# Patient Record
Sex: Male | Born: 1960
Health system: Southern US, Community
[De-identification: ages and names within clinical notes are randomized; demographics above are authoritative.]

## PROBLEM LIST (undated history)

## (undated) DIAGNOSIS — F17200 Nicotine dependence, unspecified, uncomplicated: Secondary | ICD-10-CM

## (undated) DIAGNOSIS — E785 Hyperlipidemia, unspecified: Secondary | ICD-10-CM

## (undated) DIAGNOSIS — M5030 Other cervical disc degeneration, unspecified cervical region: Secondary | ICD-10-CM

## (undated) DIAGNOSIS — F419 Anxiety disorder, unspecified: Secondary | ICD-10-CM

## (undated) HISTORY — DX: Anxiety disorder, unspecified: F41.9

## (undated) HISTORY — PX: BACK SURGERY: SHX140

## (undated) HISTORY — DX: Other cervical disc degeneration, unspecified cervical region: M50.30

## (undated) HISTORY — DX: Nicotine dependence, unspecified, uncomplicated: F17.200

## (undated) HISTORY — DX: Hyperlipidemia, unspecified: E78.5

## (undated) HISTORY — PX: SPINE SURGERY: SHX786

---

## 2000-08-07 ENCOUNTER — Ambulatory Visit (HOSPITAL_COMMUNITY): Admission: RE | Admit: 2000-08-07 | Discharge: 2000-08-07 | Payer: Self-pay

## 2000-11-25 ENCOUNTER — Emergency Department (HOSPITAL_COMMUNITY): Admission: EM | Admit: 2000-11-25 | Discharge: 2000-11-25 | Payer: Self-pay | Admitting: Emergency Medicine

## 2000-12-10 ENCOUNTER — Ambulatory Visit (HOSPITAL_COMMUNITY): Admission: RE | Admit: 2000-12-10 | Discharge: 2000-12-10 | Payer: Self-pay

## 2005-01-21 ENCOUNTER — Ambulatory Visit: Payer: Self-pay

## 2005-05-20 ENCOUNTER — Emergency Department: Payer: Self-pay | Admitting: Unknown Physician Specialty

## 2007-04-30 ENCOUNTER — Ambulatory Visit (HOSPITAL_COMMUNITY): Admission: RE | Admit: 2007-04-30 | Discharge: 2007-04-30 | Payer: Self-pay

## 2008-12-31 ENCOUNTER — Ambulatory Visit: Payer: Self-pay

## 2009-01-11 ENCOUNTER — Ambulatory Visit: Payer: Self-pay

## 2009-08-21 ENCOUNTER — Ambulatory Visit: Payer: Self-pay | Admitting: Dermatology

## 2010-05-26 HISTORY — PX: US ECHOCARDIOGRAPHY: HXRAD669

## 2010-05-26 HISTORY — PX: NM MYOCAR PERF WALL MOTION: HXRAD629

## 2010-08-08 ENCOUNTER — Ambulatory Visit: Payer: Self-pay | Admitting: General Surgery

## 2011-08-11 ENCOUNTER — Other Ambulatory Visit (HOSPITAL_COMMUNITY): Payer: Self-pay

## 2011-08-11 DIAGNOSIS — M546 Pain in thoracic spine: Secondary | ICD-10-CM

## 2011-08-13 ENCOUNTER — Ambulatory Visit (HOSPITAL_COMMUNITY)
Admission: RE | Admit: 2011-08-13 | Discharge: 2011-08-13 | Disposition: A | Discharge status: Home / Self Care | Payer: Medicare Other | Source: Ambulatory Visit

## 2011-08-13 DIAGNOSIS — M5124 Other intervertebral disc displacement, thoracic region: Secondary | ICD-10-CM | POA: Insufficient documentation

## 2011-08-13 DIAGNOSIS — M546 Pain in thoracic spine: Secondary | ICD-10-CM

## 2011-08-13 DIAGNOSIS — M4200 Juvenile osteochondrosis of spine, site unspecified: Secondary | ICD-10-CM | POA: Insufficient documentation

## 2011-08-27 ENCOUNTER — Other Ambulatory Visit (HOSPITAL_COMMUNITY): Payer: Self-pay

## 2011-08-27 DIAGNOSIS — M79602 Pain in left arm: Secondary | ICD-10-CM

## 2011-08-27 DIAGNOSIS — M542 Cervicalgia: Secondary | ICD-10-CM

## 2011-09-01 ENCOUNTER — Ambulatory Visit (HOSPITAL_COMMUNITY)
Admission: RE | Admit: 2011-09-01 | Discharge: 2011-09-01 | Disposition: A | Discharge status: Home / Self Care | Payer: Medicare Other | Source: Ambulatory Visit

## 2011-09-01 ENCOUNTER — Other Ambulatory Visit (HOSPITAL_COMMUNITY): Payer: Medicaid Other

## 2011-09-01 DIAGNOSIS — M79601 Pain in right arm: Secondary | ICD-10-CM

## 2011-09-01 DIAGNOSIS — M47812 Spondylosis without myelopathy or radiculopathy, cervical region: Secondary | ICD-10-CM | POA: Insufficient documentation

## 2011-09-01 DIAGNOSIS — M5030 Other cervical disc degeneration, unspecified cervical region: Secondary | ICD-10-CM | POA: Insufficient documentation

## 2011-09-01 DIAGNOSIS — M542 Cervicalgia: Secondary | ICD-10-CM

## 2011-09-08 ENCOUNTER — Other Ambulatory Visit (HOSPITAL_COMMUNITY): Payer: Medicaid Other

## 2012-10-13 ENCOUNTER — Emergency Department: Payer: Self-pay | Admitting: Unknown Physician Specialty

## 2012-10-13 LAB — URINALYSIS, COMPLETE
Ketone: NEGATIVE
Ph: 5 (ref 4.5–8.0)
RBC,UR: 55 /HPF (ref 0–5)
Specific Gravity: 1.013 (ref 1.003–1.030)
WBC UR: 1 /HPF (ref 0–5)

## 2012-10-13 LAB — COMPREHENSIVE METABOLIC PANEL
Albumin: 3.5 g/dL (ref 3.4–5.0)
Alkaline Phosphatase: 69 U/L (ref 50–136)
Anion Gap: 6 — ABNORMAL LOW (ref 7–16)
BUN: 15 mg/dL (ref 7–18)
Bilirubin,Total: 0.4 mg/dL (ref 0.2–1.0)
Co2: 26 mmol/L (ref 21–32)
Creatinine: 1.25 mg/dL (ref 0.60–1.30)
EGFR (African American): >60
Osmolality: 286 (ref 275–301)
Potassium: 3.7 mmol/L (ref 3.5–5.1)
SGOT(AST): 30 U/L (ref 15–37)
Total Protein: 6.2 g/dL — ABNORMAL LOW (ref 6.4–8.2)

## 2012-10-13 LAB — CBC
HGB: 13.9 g/dL (ref 13.0–18.0)
RDW: 13.4 % (ref 11.5–14.5)
WBC: 11.9 10*3/uL — ABNORMAL HIGH (ref 3.8–10.6)

## 2012-10-13 LAB — LIPASE, BLOOD: Lipase: 187 U/L (ref 73–393)

## 2012-10-28 ENCOUNTER — Ambulatory Visit: Payer: Medicare Other | Admitting: Cardiovascular Disease

## 2012-11-01 ENCOUNTER — Ambulatory Visit: Payer: Medicare Other | Admitting: Cardiovascular Disease

## 2012-11-03 ENCOUNTER — Ambulatory Visit (INDEPENDENT_AMBULATORY_CARE_PROVIDER_SITE_OTHER): Payer: Medicare Other | Admitting: Cardiovascular Disease

## 2012-11-03 ENCOUNTER — Encounter: Payer: Self-pay | Admitting: Cardiovascular Disease

## 2012-11-03 VITALS — BP 140/86 | HR 59 | Ht 71.0 in | Wt 209.6 lb

## 2012-11-03 DIAGNOSIS — E559 Vitamin D deficiency, unspecified: Secondary | ICD-10-CM

## 2012-11-03 DIAGNOSIS — R5383 Other fatigue: Secondary | ICD-10-CM

## 2012-11-03 DIAGNOSIS — E782 Mixed hyperlipidemia: Secondary | ICD-10-CM

## 2012-11-03 DIAGNOSIS — Z79899 Other long term (current) drug therapy: Secondary | ICD-10-CM

## 2012-11-03 DIAGNOSIS — Z720 Tobacco use: Secondary | ICD-10-CM | POA: Insufficient documentation

## 2012-11-03 DIAGNOSIS — R5381 Other malaise: Secondary | ICD-10-CM

## 2012-11-03 DIAGNOSIS — Z8249 Family history of ischemic heart disease and other diseases of the circulatory system: Secondary | ICD-10-CM | POA: Insufficient documentation

## 2012-11-03 DIAGNOSIS — F17200 Nicotine dependence, unspecified, uncomplicated: Secondary | ICD-10-CM

## 2012-11-03 DIAGNOSIS — E785 Hyperlipidemia, unspecified: Secondary | ICD-10-CM

## 2012-11-03 MED ORDER — ICOSAPENT ETHYL 1 G PO CAPS: 1.0000 g | ORAL_CAPSULE | Freq: Two times a day (BID) | ORAL | Status: DC

## 2012-11-03 MED ORDER — FENOFIBRATE 145 MG PO TABS: 145.0000 mg | ORAL_TABLET | Freq: Every day | ORAL | Status: DC

## 2012-11-03 NOTE — Progress Notes (Signed)
Patient ID: George Mclaughlin, male   DOB: [birth year], 52 y.o.   MRN: [id number]     HPI: Tre Sanker, is a 52 y.o. male who presents to the office today for cardiology evaluation. I last saw him over 2 1/5  years ago.  Mr. Zuercher is a strong family history for coronary artery disease. He is now 52 years old by her as a history of significant hyperlipidemia, tobacco use, as well as EtOH use drinking predominantly beer. In 2012 his cholesterol was 236, triglycerides 340, VLDL 68 and his LDL was 133 suggestive of an atherogenic dyslipidemia pattern. An echo Doppler study showed normal size and function with mild mitral and tricuspid regurgitation. A nuclear perfusion study for atypical chest pain was normal. He was initially started on Simcor 500/20. Apparently, he smokes 2-3 cigarettes several days per week but does not smoke every day. I have not seen him since. He sees Dr. Loma Sender for primary care. He states he did not been taking his previous medications. He was given a prescription for Crestor by [name] and he states at times he takes his 20 mg pill one half perhaps once or maybe twice per week but not consistently. He tells me [name] recently checked laboratory and he did not bring this with him today. But he recalls that his triglyceride was approximately 435. He did not know his other numbers.  He denies recent chest pain. Denies shortness of breath. 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 Past Medical History  Diagnosis Date  . Hyperlipidemia   . Kidney stones     Past Surgical History  Procedure Laterality Date  . Back surgery      No Known Allergies  Current Outpatient Prescriptions  Medication Sig Dispense Refill  . esomeprazole (NEXIUM) 20 MG capsule Take 20 mg by mouth daily before breakfast.      . rosuvastatin (CRESTOR) 20 MG tablet Take 20 mg by mouth daily. Takes rarely      . ALPRAZolam (XANAX) 1 MG tablet 1 mg 3 (three)  times daily as needed.       . cyclobenzaprine (FLEXERIL) 10 MG tablet 10 mg as needed.       . meloxicam (MOBIC) 7.5 MG tablet 7.5 mg as needed.       . Oxycodone HCl 10 MG TABS Take 10 mg by mouth. Prn       No current facility-administered medications for this visit.    Socially He is divorced. The past he used to drink several sixpacks of beer a week. He states he currently smokes at most 2 cigarettes on the days that he smokes but other days he does not smoke. He has 2 children. He is divorced. He takes care of his mother [name] who is one of my patients.  ROS is negative for fevers, chills or night sweats. He denies visual symptoms. He denies chest pain. Denies palpitations. There is no wheezing. He does have chronic back pain. He states he's been on disability since 2 back operations with you had done at [facility]. He denies bleeding. He does have abdominal upset intermittently. He denies diarrhea. He denies edema there are no claudication symptoms. He denies rash. At times he does note some myalgias.  Other system review is negative.  PE BP 140/86  Pulse 59  Ht 5\' 11"  (1.803 m)  Wt 209 lb 9.6 oz (95.074 kg)  BMI 29.25 kg/m2  General: Alert, oriented,  no distress.  Skin: normal turgor, no rashes HEENT: Normocephalic, atraumatic. Pupils round and reactive; sclera anicteric;no lid lag.  Nose without nasal septal hypertrophy Mouth/Parynx benign; Mallinpatti scale 3 Neck: No JVD, no carotid briuts Lungs: clear to ausculatation and percussion; no wheezing or rales Heart: RRR, s1 s2 normal  Abdomen: soft, nontender; no hepatosplenomehaly, BS+; abdominal aorta nontender and not dilated by palpation. Pulses 2+ Extremities: no clubbing cyanosis or edema, Homan's sign negative  Neurologic: grossly nonfocal  ECG: Normal sinus rhythm at 59 beats per minute. QTc interval. 6 ms  LABS:  BMET No results found for this basename: na, k, cl, co2, glucose, bun, creatinine, calcium,  gfrnonaa, gfraa     Hepatic Function Panel  No results found for this basename: prot, albumin, ast, alt, alkphos, bilitot, bilidir, ibili     CBC No results found for this basename: wbc, rbc, hgb, hct, plt, mcv, mch, mchc, rdw, neutrabs, lymphsabs, monoabs, eosabs, basosabs     BNP No results found for this basename: probnp    Lipid Panel  No results found for this basename: chol, trig, hdl, cholhdl, vldl, ldlcalc     RADIOLOGY: No results found.    ASSESSMENT AND PLAN: My impression is that Mr. Pecolia Ades is a 52 year old gentleman who has a significant family history for coronary artery disease with both parents having undergone CABG vascularization surgery. He has a history of marked hyperlipidemia with an atherogenic dyslipidemia pattern demented for several years. He does drink beer. Presently, I will try to obtain the results from his laboratory by [name]. I am recommending the addition of fenofibrate 145 mg to his medical regimen have also recommended the septa 2 capsules twice a day in light of his marked hypertriglyceridemia. I suggested he at least try taking the Crestor 10 mg every other day. I discussed smoking cessation. In 6 weeks an NMR lipoprofile, CBC, CMP,TSH, and Vit D  Level and will see him back in the office in 2 months for evaluation.    Lennette Bihari, MD, [facility]  11/03/2012 8:46 AM

## 2012-11-03 NOTE — Patient Instructions (Signed)
Your physician has recommended you make the following change in your medication: FENOFIBRATE and VASCEPRA has been sent to your pharmacy. Take as directed on the bottles. Take your crestor as directed By Dr. Tresa Endo.  Your physician recommends that you return for lab work fasting in 6 WEEKS.  Your physician recommends that you schedule a follow-up appointment in: 2-3 months.

## 2012-12-20 ENCOUNTER — Other Ambulatory Visit: Payer: Self-pay | Admitting: *Deleted

## 2012-12-20 ENCOUNTER — Telehealth: Payer: Self-pay | Admitting: Cardiovascular Disease

## 2012-12-20 DIAGNOSIS — R5381 Other malaise: Secondary | ICD-10-CM

## 2012-12-20 DIAGNOSIS — E782 Mixed hyperlipidemia: Secondary | ICD-10-CM

## 2012-12-20 DIAGNOSIS — Z79899 Other long term (current) drug therapy: Secondary | ICD-10-CM

## 2012-12-20 NOTE — Telephone Encounter (Signed)
Lab orders placed in the epic system per patient's request. He has appointment to see Dr. Tresa Endo later this month.

## 2012-12-20 NOTE — Telephone Encounter (Signed)
Has an appt with Dr Tresa Endo on 01-09-13-Suppose to have lab work before appt-he wants his lab work downstairs-Would you please send an order down there-He will have the lab work the week of Sept. 11th.

## 2012-12-29 LAB — CBC
HCT: 44.9 % (ref 39.0–52.0)
Hemoglobin: 15.8 g/dL (ref 13.0–17.0)
MCH: 31.6 pg (ref 26.0–34.0)
MCHC: 35.2 g/dL (ref 30.0–36.0)
MCV: 89.8 fL (ref 78.0–100.0)
Platelets: 189 K/uL (ref 150–400)
RBC: 5 MIL/uL (ref 4.22–5.81)
RDW: 13.7 % (ref 11.5–15.5)
WBC: 5.3 K/uL (ref 4.0–10.5)

## 2012-12-29 LAB — COMPREHENSIVE METABOLIC PANEL
AST: 32 U/L (ref 0–37)
Albumin: 4.3 g/dL (ref 3.5–5.2)
Alkaline Phosphatase: 56 U/L (ref 39–117)
BUN: 9 mg/dL (ref 6–23)
Calcium: 9.5 mg/dL (ref 8.4–10.5)
Chloride: 104 mEq/L (ref 96–112)
Potassium: 3.9 mEq/L (ref 3.5–5.3)
Sodium: 140 mEq/L (ref 135–145)
Total Protein: 6.7 g/dL (ref 6.0–8.3)

## 2012-12-30 LAB — NMR LIPOPROFILE WITH LIPIDS
Cholesterol, Total: 135 mg/dL (ref <200)
HDL Particle Number: 29.6 umol/L — ABNORMAL LOW (ref >=30.5)
LDL (calc): 75 mg/dL (ref <100)
LDL Size: 20.6 nm (ref >20.5)
LP-IR Score: 59 — ABNORMAL HIGH (ref <=45)
Large HDL-P: 2.2 umol/L — ABNORMAL LOW (ref >=4.8)
Large VLDL-P: 1.6 nmol/L (ref <=2.7)
Small LDL Particle Number: 416 nmol/L (ref <=527)

## 2012-12-30 LAB — TSH: TSH: 0.497 u[IU]/mL (ref 0.350–4.500)

## 2013-01-08 NOTE — Progress Notes (Signed)
Quick Note:  Results will be discussed at 9/22 appointment. ______

## 2013-01-09 ENCOUNTER — Ambulatory Visit (INDEPENDENT_AMBULATORY_CARE_PROVIDER_SITE_OTHER): Payer: Medicare Other | Admitting: Cardiovascular Disease

## 2013-01-09 ENCOUNTER — Encounter: Payer: Self-pay | Admitting: Cardiovascular Disease

## 2013-01-09 VITALS — BP 122/100 | HR 75 | Ht 69.0 in | Wt 203.6 lb

## 2013-01-09 DIAGNOSIS — F17200 Nicotine dependence, unspecified, uncomplicated: Secondary | ICD-10-CM

## 2013-01-09 DIAGNOSIS — Z8249 Family history of ischemic heart disease and other diseases of the circulatory system: Secondary | ICD-10-CM

## 2013-01-09 DIAGNOSIS — I2510 Atherosclerotic heart disease of native coronary artery without angina pectoris: Secondary | ICD-10-CM

## 2013-01-09 DIAGNOSIS — E782 Mixed hyperlipidemia: Secondary | ICD-10-CM

## 2013-01-09 DIAGNOSIS — Z720 Tobacco use: Secondary | ICD-10-CM

## 2013-01-09 DIAGNOSIS — E8881 Metabolic syndrome: Secondary | ICD-10-CM

## 2013-01-09 DIAGNOSIS — N529 Male erectile dysfunction, unspecified: Secondary | ICD-10-CM

## 2013-01-09 MED ORDER — SILDENAFIL CITRATE 50 MG PO TABS: ORAL_TABLET | ORAL | Status: DC

## 2013-01-09 NOTE — Patient Instructions (Addendum)
Your physician recommends that you return for lab work in: 6 MONTHS.  Your physician recommends that you schedule a follow-up appointment in: 6 months.  No changes was made in your therphy today.

## 2013-01-09 NOTE — Progress Notes (Signed)
Patient ID: George Mclaughlin, male   DOB: [birth year], 52 y.o.   MRN: [id number]     HPI: George Mclaughlin, is a 52 y.o. male who presents to the office today for cardiology evaluation. I last saw him 2 months ago.  George Mclaughlin is a 52 yo WM with a strong family history for coronary artery disease. He as a history of significant hyperlipidemia, tobacco use, as well as EtOH use drinking predominantly beer. In 2012 his cholesterol was 236, triglycerides 340, VLDL 68 and his LDL was 133 suggestive of an atherogenic dyslipidemia pattern. An echo Doppler study showed normal size and function with mild mitral and tricuspid regurgitation. A nuclear perfusion study for atypical chest pain was normal. He was initially started on Simcor 500/20. He smokes 2-3 cigarettes several days per week but does not smoke every day. He sees [name] for primary care per recent lab work by [name] apparently had shown a triglyceride level of 435. Patient has significantly reduced  his EtOH intake. I saw him 2 months ago at which time I added fenofibrate 145 mg was medical regimen and also started him on Vascepa 2 capsules twice a day in light of his marked hypertriglyceridemia. Also recommended he take Crestor 10 mg daily or every other day since he had been given a trial of this by [name] but had not been taking this he states over the past 2 months he has been much more short with reference to his diet. I did obtain recent laboratory which now shows marked improvement from previously. Laboratory from 01/05/2013 has a hemoglobin of 15.8 hematocrit 44.9. Neck his cholesterol is now 135 triglycerides 107. LDL calculated was 75 and LDL particle number by NMR was 1098. Insulin resistance score was elevated at 59. LDL particle size is now normal at 416. He denies recent chest pain. Denies shortness of breath. 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 He states he also has recently been having some  difficulty with some erectile function and also was requesting a potential for when necessary Viagra.    Past Medical History  Diagnosis Date  . Hyperlipidemia   . Kidney stones     Past Surgical History  Procedure Laterality Date  . Back surgery      No Known Allergies  Current Outpatient Prescriptions  Medication Sig Dispense Refill  . ALPRAZolam (XANAX) 1 MG tablet 1 mg 3 (three) times daily as needed.       . cyclobenzaprine (FLEXERIL) 10 MG tablet 10 mg as needed.       Marland Kitchen esomeprazole (NEXIUM) 20 MG capsule Take 20 mg by mouth daily before breakfast. PRN      . fenofibrate (TRICOR) 145 MG tablet Take 1 tablet (145 mg total) by mouth daily.  30 tablet  6  . Icosapent Ethyl (VASCEPA) 1 G CAPS Take 1 g by mouth 2 (two) times daily. 2 capsules  120 capsule  6  . meloxicam (MOBIC) 7.5 MG tablet 7.5 mg as needed.       . Oxycodone HCl 10 MG TABS Take 10 mg by mouth. Prn      . rosuvastatin (CRESTOR) 20 MG tablet Take 20 mg by mouth daily. Takes rarely       No current facility-administered medications for this visit.    Socially He is divorced. Her motor he used to drink several sixpacks of beer a week. He states he currently smokes at most 2 cigarettes  on the days that he smokes but other days he does not smoke. He has significantly reduced his EtOH intake. He has 2 children. He is divorced. He takes care of his mother [name] who is one of my patients.  ROS is negative for fevers, chills or night sweats. He denies visual symptoms. He denies chest pain. Denies palpitations. There is no wheezing. He does have chronic back pain. He states he's been on disability since 2 back operations with you had done at [facility]. He denies bleeding. He does have abdominal upset intermittently. He denies diarrhea. He recently was bothered by kidney stones and ultimately passed this several weeks ago. He does note some intermittent reduced urinary stream.  He has been dating a 50 year old woman. He  has noted some difficulty with maintenance of erectile function. He denies edema there are no claudication symptoms. He denies rash. He feels that he is now tolerating his medications.  Other system review is negative.  PE BP 122/100  Pulse 75  Ht 5\' 9"  (1.753 m)  Wt 203 lb 9.6 oz (92.352 kg)  BMI 30.05 kg/m2  General: Alert, oriented, no distress.  Skin: normal turgor, no rashes HEENT: Normocephalic, atraumatic. Pupils round and reactive; sclera anicteric;no lid lag.  Nose without nasal septal hypertrophy Mouth/Parynx benign; Mallinpatti scale 3 Neck: No JVD, no carotid briuts Lungs: clear to ausculatation and percussion; no wheezing or rales Heart: RRR, s1 s2 normal  Abdomen: soft, nontender; no hepatosplenomehaly, BS+; abdominal aorta nontender and not dilated by palpation. Pulses 2+ Extremities: no clubbing cyanosis or edema, Homan's sign negative  Neurologic: grossly nonfocal  ECG: Normal sinus rhythm at75 beats per minute. Nonspecific T. change.  LABS:  BMET    Component Value Date/Time   NA 140 12/29/2012 0959     Hepatic Function Panel     Component Value Date/Time   PROT 6.7 12/29/2012 0959     CBC    Component Value Date/Time   WBC 5.3 12/29/2012 0959     BNP No results found for this basename: probnp    Lipid Panel  No results found for this basename: chol,  trig,  hdl,  cholhdl,  vldl,  ldlcalc     RADIOLOGY: No results found.    ASSESSMENT AND PLAN: Mr. George Mclaughlin feels significantly improved from several months ago. He is also proximally 6 pounds. He now has been on a combination regimen including fenofibrate, Vascepa, and Crestor with marked benefit in his most recent lipid status. Triglycerides are now normal at 107 down from 435 when taken by [name]. He does have increased insulin resistance score. Fasting glucose is normal. Blood pressure was improved when taken by me today. We again discussed complete smoking cessation. Also discussed  continued alcohol reduction. We discussed increased exercise. He is really trying hard with reference to his diet. I commended him on his effort. He did request a potential trial of Viagra to help with his erectile function issues. I will give him a prescription for 10 pills of 50 mg he can take one half to one for improvement of his function. In 6 months, am repeating an MRI Bible profile. I will also check a comp hasn't metabolic panel. I will also check an insulin level at that time since he is insulin resistant.   Lennette Bihari, MD, [facility]  01/09/2013 10:00 AM

## 2013-03-07 ENCOUNTER — Other Ambulatory Visit: Payer: Self-pay | Admitting: *Deleted

## 2013-03-07 MED ORDER — ROSUVASTATIN CALCIUM 20 MG PO TABS: 20.0000 mg | ORAL_TABLET | Freq: Every day | ORAL | Status: DC

## 2013-04-19 ENCOUNTER — Other Ambulatory Visit: Payer: Self-pay | Admitting: *Deleted

## 2013-04-19 MED ORDER — SILDENAFIL CITRATE 50 MG PO TABS: ORAL_TABLET | ORAL | Status: DC

## 2013-06-16 ENCOUNTER — Encounter: Payer: Self-pay | Admitting: *Deleted

## 2013-06-16 ENCOUNTER — Other Ambulatory Visit: Payer: Self-pay | Admitting: *Deleted

## 2013-06-16 DIAGNOSIS — I2510 Atherosclerotic heart disease of native coronary artery without angina pectoris: Secondary | ICD-10-CM

## 2013-06-16 DIAGNOSIS — E8881 Metabolic syndrome: Secondary | ICD-10-CM

## 2013-06-21 LAB — CBC
HCT: 44.5 % (ref 39.0–52.0)
HEMOGLOBIN: 16 g/dL (ref 13.0–17.0)
MCH: 32.4 pg (ref 26.0–34.0)
MCHC: 36 g/dL (ref 30.0–36.0)
MCV: 90.1 fL (ref 78.0–100.0)
Platelets: 175 10*3/uL (ref 150–400)
RBC: 4.94 MIL/uL (ref 4.22–5.81)
RDW: 13.6 % (ref 11.5–15.5)
WBC: 5.7 10*3/uL (ref 4.0–10.5)

## 2013-06-22 LAB — INSULIN, FASTING: INSULIN FASTING, SERUM: 22 u[IU]/mL (ref 3–28)

## 2013-06-23 LAB — NMR LIPOPROFILE WITH LIPIDS
Cholesterol, Total: 178 mg/dL (ref <200)
HDL PARTICLE NUMBER: 31.8 umol/L (ref >=30.5)
HDL Size: 8.4 nm — ABNORMAL LOW (ref >=9.2)
HDL-C: 48 mg/dL (ref >=40)
LDL CALC: 103 mg/dL — AB (ref <100)
LDL Particle Number: 1498 nmol/L — ABNORMAL HIGH (ref <1000)
LDL Size: 20.9 nm (ref >20.5)
LP-IR Score: 52 — ABNORMAL HIGH (ref <=45)
Large HDL-P: 1.3 umol/L — ABNORMAL LOW (ref >=4.8)
Large VLDL-P: 1.2 nmol/L (ref <=2.7)
Small LDL Particle Number: 792 nmol/L — ABNORMAL HIGH (ref <=527)
Triglycerides: 133 mg/dL (ref <150)
VLDL Size: 41.7 nm (ref <=46.6)

## 2013-07-11 ENCOUNTER — Encounter: Payer: Self-pay | Admitting: *Deleted

## 2013-07-13 ENCOUNTER — Ambulatory Visit (INDEPENDENT_AMBULATORY_CARE_PROVIDER_SITE_OTHER): Payer: Medicare Other | Admitting: Cardiovascular Disease

## 2013-07-13 ENCOUNTER — Encounter: Payer: Self-pay | Admitting: Cardiovascular Disease

## 2013-07-13 VITALS — BP 122/92 | HR 80 | Ht 69.0 in | Wt 203.3 lb

## 2013-07-13 DIAGNOSIS — E782 Mixed hyperlipidemia: Secondary | ICD-10-CM

## 2013-07-13 DIAGNOSIS — F17200 Nicotine dependence, unspecified, uncomplicated: Secondary | ICD-10-CM

## 2013-07-13 DIAGNOSIS — N529 Male erectile dysfunction, unspecified: Secondary | ICD-10-CM

## 2013-07-13 DIAGNOSIS — I2510 Atherosclerotic heart disease of native coronary artery without angina pectoris: Secondary | ICD-10-CM

## 2013-07-13 DIAGNOSIS — Z720 Tobacco use: Secondary | ICD-10-CM

## 2013-07-13 DIAGNOSIS — Z8249 Family history of ischemic heart disease and other diseases of the circulatory system: Secondary | ICD-10-CM

## 2013-07-13 NOTE — Patient Instructions (Signed)
Your physician recommends that you schedule a follow-up appointment in: 6 Months  Your physician has recommended you make the following change in your medication: Take Crestor Daily

## 2013-07-13 NOTE — Progress Notes (Signed)
Patient ID: George Mclaughlin, male   DOB: December 03, 1960, 53 y.o.   MRN: [id number]      HPI: George Mclaughlin is a 53 y.o. male who presents to the office today for a 6 month cardiology evaluation.  George Mclaughlin is a 53 yo WM with a strong family history for coronary artery disease. He as a history of significant hyperlipidemia, tobacco use, as well as EtOH use drinking predominantly beer. In 2012 his cholesterol was 236, triglycerides 340, VLDL 68 and his LDL was 133 suggestive of an atherogenic dyslipidemia pattern. An echo Doppler study showed normal size and function with mild mitral and tricuspid regurgitation. A nuclear perfusion study for atypical chest pain was normal. He was initially started on Simcor 500/20. He smokes 2-3 cigarettes several days per week but does not smoke every day. He sees [name] for primary care and lab work by [name] last year had shown a triglyceride level of 435.  I saw him 8 months ago at which time I added fenofibrate 145 mg  and also started him on Vascepa 2 capsules twice a day in light of his marked hypertriglyceridemia. Also recommended he take Crestor 10 mg daily or every other day since he had been given a trial of this by [name] but had not been taking this he states over the past 2 months he has been much more short with reference to his diet. I did obtain recent laboratory which now shows marked improvement from previously. Laboratory from 01/05/2013 has a hemoglobin of 15.8 hematocrit 44.9. Neck his cholesterol is now 135 triglycerides 107. LDL calculated was 75 and LDL particle number by NMR was 1098. Insulin resistance score was elevated at 59.Small  LDL particle size improved to 416. When I last saw him, I recommended he take Crestor 20 mg daily. States in the last several months, he has not been exercising as he had in the past. He does continue to drink alcohol. He admits to drinking several beers last night and actually presented to the office today  with a residual mild hangover.  He did undergo recent followup laboratory which was done 3 weeks ago. His LDL particle member had increased to 1498 from 1098. LDL calculated cholesterol had increased from 75-103. His LDL particle member had increased to 792. Fortunately his triglycerides still remain fairly normal at 133. Total cholesterol was 178. Insulin resistance score was 52. He presents for evaluation.  He denies chest pain. Denies PND orthopnea. No palpitations. He smokes approximately 3 cigarettes almost but not every day.  Past Medical History  Diagnosis Date  . Hyperlipidemia   . Kidney stones     Past Surgical History  Procedure Laterality Date  . Back surgery  1999 & 2000  . US echocardiography  05/26/2010    mild MR  . Nm myocar perf wall motion  05/26/2010    Normal    No Known Allergies  Current Outpatient Prescriptions  Medication Sig Dispense Refill  . ALPRAZolam (XANAX) 1 MG tablet 1 mg 3 (three) times daily as needed.       . cyclobenzaprine (FLEXERIL) 10 MG tablet 10 mg as needed.       Marland Kitchen esomeprazole (NEXIUM) 20 MG capsule Take 20 mg by mouth daily before breakfast. PRN      . fenofibrate (TRICOR) 145 MG tablet Take 1 tablet (145 mg total) by mouth daily.  30 tablet  6  . Icosapent Ethyl (VASCEPA) 1 G CAPS Take 1 g by mouth  2 (two) times daily. 2 capsules  120 capsule  6  . meloxicam (MOBIC) 7.5 MG tablet 7.5 mg as needed.       . Oxycodone HCl 10 MG TABS Take 10 mg by mouth. Prn      . rosuvastatin (CRESTOR) 20 MG tablet Take 20 mg by mouth daily.      . sildenafil (VIAGRA) 50 MG tablet Take 1/2 to 1 tablet as needed.  10 tablet  0   No current facility-administered medications for this visit.    Socially He is divorced. Previously, he used to drink several sixpacks of beer a week. He states he currently smokes at most 2 cigarettes on the days that he smokes but other days he does not smoke. He denies change in hearing. He denies change in weight. His other  lymphadenopathy. He has 2 children. He is divorced. He takes care of his mother George Mclaughlin who is one of my patients.  ROS is negative for fevers, chills or night sweats. He denies visual symptoms. He denies chest pain. Denies palpitations. There is no wheezing. He does have chronic back pain. He states he's been on disability since 2 back operations [facility]. He denies bleeding. He does have abdominal upset intermittently. He denies diarrhea. There is a history of kidney stones. He does note some intermittent reduced urinary stream.    He has noted some difficulty with maintenance of erectile function. He denies edema there are no claudication symptoms. He denies rash. He feels that he is now tolerating his medications. He sees Dr. Newell CoralNudelman for a   tumor "on his back  Other comprehensive 14 point system review is negative.  PE BP 122/92  Pulse 80  Ht 5\' 9"  (1.753 m)  Wt 203 lb 4.8 oz (92.216 kg)  BMI 30.01 kg/m2  General: Alert, oriented, no distress.  Skin: normal turgor, no rashes HEENT: Normocephalic, atraumatic. Pupils round and reactive; sclera anicteric;no lid lag.  Nose without nasal septal hypertrophy Mouth/Parynx benign; Mallinpatti scale 3 Neck: No JVD, no carotid bruits with normal carotid upstroke no chest wall tenderness to palpation Lungs: clear to ausculatation and percussion; no wheezing or rales  Heart: RRR, s1 s2 normal  Abdomen: soft, nontender; no hepatosplenomehaly, BS+; abdominal aorta nontender and not dilated by palpation. Pulses 2+  2/6 systolic. No S3 or S4 gallop heard no diastolic murmur. No rubs thrills or heaves. Extremities: no clubbing cyanosis or edema, Homan's sign negative  Neurologic: grossly nonfocal Psychological: Affect and mood.   ECG (indeterminate by me): Normal sinus rhythm at 80 beats per minute. Normal intervals. Nondiagnostic T changes in lead 3.  Prior 01/09/2013 ECG: Normal sinus rhythm at75 beats per minute. Nonspecific T.  change.  LABS:  BMET    Component Value Date/Time   NA 140 12/29/2012 0959     Hepatic Function Panel     Component Value Date/Time   PROT 6.7 12/29/2012 0959     CBC    Component Value Date/Time   WBC 5.7 06/21/2013 0817     BNP No results found for this basename: probnp    Lipid Panel  No results found for this basename: chol,  trig,  hdl,  cholhdl,  vldl,  ldlcalc     RADIOLOGY: No results found.    ASSESSMENT AND PLAN: Mr. Dossie ArbourCrissman is a 53 year old gentleman with a very strong family history for CAD, significant hyperlipidemia, ongoing tobacco use. Since I last saw him, he has not been exercising as regularly  as he had in the past. He had previosly reduced his alcohol intake but again seems to have slightly increased this. His most recent laboratory does show in his lipid studies. Upon further questioning does have taking the Crestor 20 mg daily he is rarely taking this. He also is inconsistent in taking his fenofibrate. I have recommended that he take his medications on a daily basis. We also discussed the importance of reducing his alcohol intake. We discussed the importance of exercise and complete smoking cessation. He had undergone an echo Doppler and nuclear perfusion imaging studies in 2012 which were fairly normal. I will see him in 6 months for cardiology reevaluation prior to that office visit repeat laboratory will be obtained.  Time spent: 25 minute   Lennette Bihari, MD, [facility]  07/13/2013 9:29 AM

## 2013-11-02 ENCOUNTER — Telehealth: Payer: Self-pay | Admitting: Cardiovascular Disease

## 2013-11-02 NOTE — Telephone Encounter (Signed)
Returned patient's call to schedule recall appointment with Dr. Tresa EndoKelly

## 2013-11-29 ENCOUNTER — Other Ambulatory Visit: Payer: Self-pay | Admitting: *Deleted

## 2013-11-29 MED ORDER — FENOFIBRATE 145 MG PO TABS: 145.0000 mg | ORAL_TABLET | Freq: Every day | ORAL | Status: DC

## 2013-11-29 NOTE — Telephone Encounter (Signed)
Rx refill sent to patient pharmacy   

## 2014-01-05 ENCOUNTER — Telehealth: Payer: Self-pay | Admitting: Cardiovascular Disease

## 2014-01-05 DIAGNOSIS — Z8249 Family history of ischemic heart disease and other diseases of the circulatory system: Secondary | ICD-10-CM

## 2014-01-05 DIAGNOSIS — E782 Mixed hyperlipidemia: Secondary | ICD-10-CM

## 2014-01-05 NOTE — Telephone Encounter (Signed)
Pt is scheduled to see Dr Tresa Endo on Wednesday(01-10-14). He says he usually have blood work before he see him,had not heard or received anything this time.

## 2014-01-05 NOTE — Addendum Note (Signed)
Addended by: Freddi Starr on: 01/05/2014 11:20 AM   Modules accepted: Orders

## 2014-01-05 NOTE — Telephone Encounter (Signed)
Spoke with pt, aware will place orders for labs but the results of the lipoprofile will not be back at the time of his appt. Pt rescheduled appt to later date so labs will be available at appt time

## 2014-01-10 ENCOUNTER — Ambulatory Visit: Payer: Medicare Other | Admitting: Cardiovascular Disease

## 2014-01-30 ENCOUNTER — Other Ambulatory Visit: Payer: Self-pay | Admitting: *Deleted

## 2014-01-30 MED ORDER — SILDENAFIL CITRATE 50 MG PO TABS: ORAL_TABLET | ORAL | Status: DC

## 2014-01-30 MED ORDER — ROSUVASTATIN CALCIUM 20 MG PO TABS: 20.0000 mg | ORAL_TABLET | Freq: Every day | ORAL | Status: DC

## 2014-01-30 NOTE — Telephone Encounter (Signed)
Medication refilled electronically 

## 2014-02-13 ENCOUNTER — Encounter: Payer: Self-pay | Admitting: Cardiovascular Disease

## 2014-02-13 ENCOUNTER — Telehealth: Payer: Self-pay | Admitting: Cardiovascular Disease

## 2014-02-13 DIAGNOSIS — R5383 Other fatigue: Secondary | ICD-10-CM

## 2014-02-13 DIAGNOSIS — Z79899 Other long term (current) drug therapy: Secondary | ICD-10-CM

## 2014-02-13 LAB — CBC
HEMATOCRIT: 45.2 % (ref 39.0–52.0)
HEMOGLOBIN: 16.4 g/dL (ref 13.0–17.0)
MCH: 32.3 pg (ref 26.0–34.0)
MCHC: 36.3 g/dL — ABNORMAL HIGH (ref 30.0–36.0)
MCV: 89.2 fL (ref 78.0–100.0)
Platelets: 186 10*3/uL (ref 150–400)
RBC: 5.07 MIL/uL (ref 4.22–5.81)
RDW: 13.1 % (ref 11.5–15.5)
WBC: 6.1 10*3/uL (ref 4.0–10.5)

## 2014-02-13 LAB — COMPREHENSIVE METABOLIC PANEL
ALT: 29 U/L (ref 0–53)
AST: 25 U/L (ref 0–37)
Albumin: 4.4 g/dL (ref 3.5–5.2)
Alkaline Phosphatase: 60 U/L (ref 39–117)
BILIRUBIN TOTAL: 0.7 mg/dL (ref 0.2–1.2)
BUN: 13 mg/dL (ref 6–23)
CHLORIDE: 104 meq/L (ref 96–112)
CO2: 22 mEq/L (ref 19–32)
CREATININE: 0.95 mg/dL (ref 0.50–1.35)
Calcium: 9.3 mg/dL (ref 8.4–10.5)
GLUCOSE: 94 mg/dL (ref 70–99)
Potassium: 4.2 mEq/L (ref 3.5–5.3)
Sodium: 135 mEq/L (ref 135–145)
Total Protein: 6.9 g/dL (ref 6.0–8.3)

## 2014-02-13 LAB — TSH: TSH: 0.929 u[IU]/mL (ref 0.350–4.500)

## 2014-02-13 NOTE — Telephone Encounter (Signed)
CBC, CMET, TSH, NMR ordered to be done prior to OV on 11/17  Will defer to Dr. Tresa EndoKelly to see if he would like to draw a vitamin D level as well?

## 2014-02-13 NOTE — Telephone Encounter (Signed)
Pt called in stating that he has not had his Vitamin D levels checked in almost 2 yrs. And he would like to have them checked. Please call  Thanks

## 2014-02-15 LAB — NMR LIPOPROFILE WITH LIPIDS
CHOLESTEROL, TOTAL: 202 mg/dL — AB (ref 100–199)
HDL Particle Number: 28.5 umol/L — ABNORMAL LOW (ref >=30.5)
HDL SIZE: 8.5 nm — AB (ref >=9.2)
HDL-C: 41 mg/dL (ref >39)
LDL CALC: 131 mg/dL — AB (ref 0–99)
LDL Particle Number: 1647 nmol/L — ABNORMAL HIGH (ref <1000)
LDL Size: 21 nm (ref >=20.8)
LP-IR SCORE: 67 — AB (ref <=45)
Large HDL-P: 1.7 umol/L — ABNORMAL LOW (ref >=4.8)
Large VLDL-P: 4.8 nmol/L — ABNORMAL HIGH (ref <=2.7)
Small LDL Particle Number: 599 nmol/L — ABNORMAL HIGH (ref <=527)
TRIGLYCERIDES: 152 mg/dL — AB (ref 0–149)
VLDL SIZE: 44.3 nm (ref <=46.6)

## 2014-02-15 NOTE — Telephone Encounter (Signed)
Ok to check Vit D

## 2014-02-16 LAB — VITAMIN D 25 HYDROXY (VIT D DEFICIENCY, FRACTURES): VIT D 25 HYDROXY: 32 ng/mL (ref 30–89)

## 2014-02-16 NOTE — Telephone Encounter (Signed)
Ordered and [facility] notified.

## 2014-02-16 NOTE — Addendum Note (Signed)
Addended by: Lindell SparELKINS, JENNA M on: 02/16/2014 08:14 AM   Modules accepted: Orders

## 2014-02-23 ENCOUNTER — Telehealth: Payer: Self-pay | Admitting: *Deleted

## 2014-02-23 NOTE — Telephone Encounter (Signed)
Called to inform patient per Dr. Tresa EndoKelly labs look good except his NMR profile. Increase the crestor to 40 mg daily. Patient admits to me that he has not been taking his crestor but only once or twice a week due to causing him muscle pain. I recommended to patient that since he has not been compliant with his crestor, then start to take it regularly and discuss the increase at his 03/06/14 appointment. Patient agreed with plan.

## 2014-02-23 NOTE — Telephone Encounter (Signed)
-----   Message from Lennette Biharihomas A Kelly, MD sent at 02/18/2014  9:50 AM EST ----- Inc crestor to 40 mg

## 2014-02-27 ENCOUNTER — Ambulatory Visit: Payer: Medicare Other | Admitting: Cardiovascular Disease

## 2014-03-05 ENCOUNTER — Other Ambulatory Visit: Payer: Self-pay

## 2014-03-05 MED ORDER — ROSUVASTATIN CALCIUM 20 MG PO TABS: 20.0000 mg | ORAL_TABLET | Freq: Every day | ORAL | Status: DC

## 2014-03-05 NOTE — Telephone Encounter (Signed)
Rx sent to pharmacy   

## 2014-03-06 ENCOUNTER — Ambulatory Visit (INDEPENDENT_AMBULATORY_CARE_PROVIDER_SITE_OTHER): Payer: Medicare Other | Admitting: Cardiovascular Disease

## 2014-03-06 ENCOUNTER — Encounter: Payer: Self-pay | Admitting: Cardiovascular Disease

## 2014-03-06 VITALS — BP 120/90 | HR 72 | Ht 69.0 in | Wt 207.0 lb

## 2014-03-06 DIAGNOSIS — Z79899 Other long term (current) drug therapy: Secondary | ICD-10-CM | POA: Diagnosis not present

## 2014-03-06 DIAGNOSIS — I2510 Atherosclerotic heart disease of native coronary artery without angina pectoris: Secondary | ICD-10-CM

## 2014-03-06 DIAGNOSIS — E785 Hyperlipidemia, unspecified: Secondary | ICD-10-CM | POA: Diagnosis not present

## 2014-03-06 DIAGNOSIS — N529 Male erectile dysfunction, unspecified: Secondary | ICD-10-CM

## 2014-03-06 DIAGNOSIS — Z8249 Family history of ischemic heart disease and other diseases of the circulatory system: Secondary | ICD-10-CM

## 2014-03-06 DIAGNOSIS — E782 Mixed hyperlipidemia: Secondary | ICD-10-CM

## 2014-03-06 DIAGNOSIS — Z720 Tobacco use: Secondary | ICD-10-CM

## 2014-03-06 MED ORDER — ROSUVASTATIN CALCIUM 40 MG PO TABS: 40.0000 mg | ORAL_TABLET | Freq: Every day | ORAL | Status: DC

## 2014-03-06 MED ORDER — FENOFIBRATE 145 MG PO TABS: 145.0000 mg | ORAL_TABLET | Freq: Every day | ORAL | Status: DC

## 2014-03-06 NOTE — Patient Instructions (Signed)
Your physician has recommended you make the following change in your medication: increase the crestor to 40 mg daily.  Your physician recommends that you return for lab work in: 3 months.  Your physician wants you to follow-up in: 6 months or sooner if needed. You will receive a reminder letter in the mail two months in advance. If you don't receive a letter, please call our office to schedule the follow-up appointment.

## 2014-03-08 ENCOUNTER — Encounter: Payer: Self-pay | Admitting: Cardiovascular Disease

## 2014-03-08 NOTE — Progress Notes (Signed)
Patient ID: George Mclaughlin, male   DOB: April 11, 1961, 53 y.o.   MRN: [id number]      HPI: George Mclaughlin is a 53 y.o. male who presents to the office today for a 8 month cardiology evaluation.  George Mclaughlin has a strong family history for coronary artery disease. He as a history of significant hyperlipidemia, tobacco use, as well as EtOH use drinking predominantly beer. In 2012 his cholesterol was 236, triglycerides 340, VLDL 68 and his LDL was 133 suggestive of an atherogenic dyslipidemia pattern. An echo Doppler study showed normal size and function with mild mitral and tricuspid regurgitation. A nuclear perfusion study for atypical chest pain was normal. He was initially started on Simcor 500/20. He smokes 2-3 cigarettes several days per week but does not smoke every day. He sees Dr. Hardin Negus for primary care and lab work by Dr. Hardin Negus last year had shown a triglyceride level of 435.  I saw him a year ago I added fenofibrate 145 mg  and also started him on Vascepa 2 capsules twice a day in light of his marked hypertriglyceridemia. I also recommended he take Crestor 10 mg daily or every other day since he had been given a trial of this by Dr. Hardin Negus but had not been taking this he states over the past 2 months he has been much more short with reference to his diet. I did obtain recent laboratory which now shows marked improvement from previously. Laboratory from 01/05/2013 has a hemoglobin of 15.8 hematocrit 44.9. Neck his cholesterol is now 135 triglycerides 107. LDL calculated was 75 and LDL particle number by NMR was 1098. Insulin resistance score was elevated at 59.Small  LDL particle size improved to 416.  Eight months ago, Hhs LDL particle member had increased to 1498 from 1098. LDL calculated cholesterol had increased from 75-103. His LDL particle member had increased to 792. Fortunately his triglycerides still remain fairly normal at 133. Total cholesterol was 178. Insulin resistance score was  52.   He presents to the office today for follow-up evaluation.  He has been taking Crestor 20 mg, by mouth 5 rate 145 mg, and the septa capsules twice a day.  He underwent follow-up laboratory 3 weeks ago which showed worsening of his lipid study in his LDL particle number was now increased at 1647, calculated LDL 131, triglycerides 152, total cholesterol 202, and although was HDLC was normal at 41, his HDL particle number was low at 28.5.  He presents for evaluation.  Past Medical History  Diagnosis Date  . Hyperlipidemia   . Kidney stones     Past Surgical History  Procedure Laterality Date  . Back surgery  1999 & 2000  . US echocardiography  05/26/2010    mild MR  . Nm myocar perf wall motion  05/26/2010    Normal    No Known Allergies  Current Outpatient Prescriptions  Medication Sig Dispense Refill  . ALPRAZolam (XANAX) 1 MG tablet 1 mg 3 (three) times daily as needed.     . cyclobenzaprine (FLEXERIL) 10 MG tablet 10 mg as needed.     Marland Kitchen esomeprazole (NEXIUM) 20 MG capsule Take 20 mg by mouth daily before breakfast. PRN    . fenofibrate (TRICOR) 145 MG tablet Take 1 tablet (145 mg total) by mouth daily. 90 tablet 3  . Icosapent Ethyl (VASCEPA) 1 G CAPS Take 1 g by mouth 2 (two) times daily. 2 capsules 120 capsule 6  . meloxicam (MOBIC) 7.5 MG tablet  7.5 mg as needed.     . Oxycodone HCl 10 MG TABS Take 10 mg by mouth. Prn    . sildenafil (VIAGRA) 50 MG tablet Take 1/2 to 1 tablet as needed. 10 tablet 0  . rosuvastatin (CRESTOR) 40 MG tablet Take 1 tablet (40 mg total) by mouth daily. 90 tablet 3   No current facility-administered medications for this visit.    Socially He is divorced. Previously, he used to drink several sixpacks of beer a week. He states he currently smokes at most 2 cigarettes on the days that he smokes but other days he does not smoke. He denies change in hearing. He denies change in weight. His other lymphadenopathy. He has 2 children. He is divorced. He  takes care of his mother George Mclaughlin who is one of my patients.  ROS General: Negative; No fevers, chills, or night sweats;  HEENT: Negative; No changes in vision or hearing, sinus congestion, difficulty swallowing Pulmonary: Negative; No cough, wheezing, shortness of breath, hemoptysis Cardiovascular: Negative; No chest pain, presyncope, syncope, palpitations GI: Negative; No nausea, vomiting, diarrhea, or abdominal pain GU: Mild erectile dysfunction; No dysuria, hematuria, or difficulty voiding Musculoskeletal: Occasional back discomfort; no myalgias, joint pain, or weakness Hematologic/Oncology: Negative; no easy bruising, bleeding Endocrine: Negative; no heat/cold intolerance; no diabetes Neuro: Negative; no changes in balance, headaches Skin: Negative; No rashes or skin lesions Psychiatric: Negative; No behavioral problems, depression Sleep: Negative; No snoring, daytime sleepiness, hypersomnolence, bruxism, restless legs, hypnogognic hallucinations, no cataplexy Other comprehensive 14 point system review is negative.   PE BP 120/90 mmHg  Pulse 72  Ht 5' 9" (1.753 m)  Wt 207 lb (93.895 kg)  BMI 30.55 kg/m2  General: Alert, oriented, no distress.  Skin: normal turgor, no rashes HEENT: Normocephalic, atraumatic. Pupils round and reactive; sclera anicteric;no lid lag.  Nose without nasal septal hypertrophy Mouth/Parynx benign; Mallinpatti scale 3 Neck: No JVD, no carotid bruits with normal carotid upstroke no chest wall tenderness to palpation Chest wall: Nontender to palpation Lungs: clear to ausculatation and percussion; no wheezing or rales Heart: RRR, s1 s2 normal, 1/6 systolic murmur.  No diastolic murmur.  No rubs thrills or heaves.  No gallops. Abdomen: soft, nontender; no hepatosplenomehaly, BS+; abdominal aorta nontender and not dilated by palpation. Back: No CVA tenderness Pulses 2+ Extremities: no clubbing cyanosis or edema, Homan's sign negative  Neurologic:  grossly nonfocal Psychological: Affect and mood.  ECG (independently read by me): Normal sinus rhythm at 72.  Incomplete right bundle branch block.  No ectopy.  March 2015  ECG: Normal sinus rhythm at 80 beats per minute. Normal intervals. Nondiagnostic T changes in lead 3.  Prior 01/09/2013 ECG: Normal sinus rhythm at75 beats per minute. Nonspecific T. change.  LABS:  BMET    Component Value Date/Time   NA 135 02/13/2014 0846   BMET    Component Value Date/Time   NA 135 02/13/2014 0846   K 4.2 02/13/2014 0846   CL 104 02/13/2014 0846   CO2 22 02/13/2014 0846   GLUCOSE 94 02/13/2014 0846   BUN 13 02/13/2014 0846   CREATININE 0.95 02/13/2014 0846   CALCIUM 9.3 02/13/2014 0846     Hepatic Function Panel     Component Value Date/Time   PROT 6.9 02/13/2014 0846   Hepatic Function Latest Ref Rng 02/13/2014 12/29/2012  Total Protein 6.0 - 8.3 g/dL 6.9 6.7  Albumin 3.5 - 5.2 g/dL 4.4 4.3  AST 0 - 37 U/L 25 32  ALT 0 -  53 U/L 29 36  Alk Phosphatase 39 - 117 U/L 60 56  Total Bilirubin 0.2 - 1.2 mg/dL 0.7 0.8     CBC    Component Value Date/Time   WBC 6.1 02/13/2014 0846   CBC Latest Ref Rng 02/13/2014 06/21/2013 12/29/2012  WBC 4.0 - 10.5 K/uL 6.1 5.7 5.3  Hemoglobin 13.0 - 17.0 g/dL 16.4 16.0 15.8  Hematocrit 39.0 - 52.0 % 45.2 44.5 44.9  Platelets 150 - 400 K/uL 186 175 189     BNP No results found for: PROBNP  Lipid Panel       Ref Range 3wk ago  74moago  148yrgo     LDL Particle Number <1000 nmol/L 1647 (H) 1498 (H)CM 1098 (H)CM   Comments:             Low          < 1000             Moderate     1000 - 1299             Borderline-High 1300 - 1599             High       1600 - 2000             Very High       > 2000    LDL (calc) 0 - 99 mg/dL 131 (H) 103 (H)R, CM 75R, CM   Comments:             Optimal        < 100              Above optimal   100 - 129             Borderline    130 - 159             High       160 - 189             Very high       > 189       **Please note reference interval change** LDL-C is inaccurate if patient is non-fasting.    HDL-C >39 mg/dL 41 =40 mg/dL" class="rz_1a" style="cursor: pointer;" onmouseover='jscript: var varStyle="underline"; var bgColor="#[id number]"; this.style.backgroundColor=bgColor; var children=this.getElementsByTagName("div"); for(var child=0;child 48R =40 mg/dL" class="rz_1b" style="cursor: pointer;" onmouseover='jscript: var varStyle="underline"; var bgColor="#[email or web address]"; this.style.backgroundColor=bgColor; var children=this.getElementsByTagName("div"); for(var child=0;child 39 (L)R    Triglycerides 0 - 149 mg/dL 152 (H) 133R 107R   Comments:       **Please note reference interval change**    Cholesterol, Total 100 - 199 mg/dL 202 (H) 178R 135R   Comments:       **Please note reference interval change**    HDL Particle Number >=30.5 umol/L 28.5 (L) =30.5 umol/L" class="rz_1a" style="cursor: pointer;" onmouseover='jscript: var varStyle="underline"; var bgColor="#[email or web address]"; this.style.backgroundColor=bgColor; var children=this.getElementsByTagName("div"); for(var child=0;child 31.8 =30.5 umol/L" class="rz_1b" style="cursor: pointer;" onmouseover='jscript: var varStyle="underline"; var bgColor="#[email or web address]"; this.style.backgroundColor=bgColor; var children=this.getElementsByTagName("div"); for(var child=0;child 29.6 (L)    Large HDL-P >=4.8 umol/L 1.7 (L) =4.8 umol/L" class="rz_1a" style="cursor: pointer;" onmouseover='jscript: var varStyle="underline"; var bgColor="#[email or web address]"; this.style.backgroundColor=bgColor; var children=this.getElementsByTagName("div"); for(var child=0;child 1.3 (L) =4.8 umol/L" class="rz_1b" style="cursor: pointer;" onmouseover='jscript: var varStyle="underline";  var bgColor="#[email or web address]"; this.style.backgroundColor=bgColor; var children=this.getElementsByTagName("div"); for(var child=0;child 2.2 (L)    Large VLDL-P <=2.7 nmol/L 4.8 (H) 1.2 1.6    Small LDL Particle Number <=527 nmol/L 599 (H) 792 (H) 416    LDL Size >=20.8 nm 21.0 20.5 nm" class="rz_1a" style="cursor: pointer;" onmouseover='jscript: var varStyle="underline"; var bgColor="#[email or web address]";  this.style.backgroundColor=bgColor; var children=this.getElementsByTagName("div"); for(var child=0;child 20.9R 20.5 nm" class="rz_1b" style="cursor: pointer;" onmouseover='jscript: var varStyle="underline"; var bgColor="#[email or web address]"; this.style.backgroundColor=bgColor; var children=this.getElementsByTagName("div"); for(var child=0;child 20.6R    HDL Size >=9.2 nm 8.5 (L) =9.2 nm" class="rz_1a" style="cursor: pointer;" onmouseover='jscript: var varStyle="underline"; var bgColor="#[email or web address]"; this.style.backgroundColor=bgColor; var children=this.getElementsByTagName("div"); for(var child=0;child 8.4 (L) =9.2 nm" class="rz_1b" style="cursor: pointer; background-color: rgb(222, 231, 239);" onmouseover='jscript: var varStyle="underline"; var bgColor="#[email or web address]"; this.style.backgroundColor=bgColor; var children=this.getElementsByTagName("div"); for(var child=0;child 8.5 (L)    VLDL Size <=46.6 nm 44.3 41.7 44.1    LP-IR Score <=45  67 (H) 52 (H)CM 59 (H)CM   Comments: ----------------------------------------------------------       INSULIN RESISTANCE / DIABETES RISK MARKERS      <--Insulin Sensitive   Insulin Resistant-->           Percentile in Reference Population Large VLDL-P   Low   25th   50th   75th   High          <0.9  0.9   2.7   6.9   >6.9 Small LDL-P    Low   25th   50th   75th   High          <117  117   527   839   >839 Large HDL-P    High  75th   50th   25th   Low          >7.3  7.3    4.8   3.1   <3.1 VLDL Size     Small  25th   50th   75th   Large          <42.4  42.4   46.6   52.5   >52.5 LDL Size     Large  75th   50th   25th   Small          >21.2  21.2   20.8   20.4   <20.4 HDL Size     Large  75th   50th   25th   Small          >9.6  9.6   9.2   8.9   <8.9 Insulin Resistance Score LP-IR SCORE    Low   25th   50th   75th   High          <27   27    45    63    >63       RADIOLOGY: No results found.    ASSESSMENT AND PLAN: George Mclaughlin is a 53 year old gentleman with a very strong family history for CAD, significant hyperlipidemia, ongoing tobacco use.  He admits that he has not been routinely exercising.  His blood pressure today is controlled at 124/84.  I spent considerable time with him discussing his most recent NMR profile.  I am recommending further titration of his Crestor to 40 mg and also suggested that he take coenzyme Q10 200-300 mg daily.  We discussed the importance of ETOH reduction.  He is not having chest pain.  He has GERD but this is controlled with Nexium.  He takes Viagra as needed for erectile dysfunction and is tolerating this.  His ECG remained stable.  His body mass index today is 30.55, compatible with mild obesity.  I discussed importance of weight loss in addition to his increased exercise and proper diet.   Time spent: 25 minute   Troy Sine, MD, [facility]  03/08/2014 2:05 PM

## 2014-03-20 ENCOUNTER — Other Ambulatory Visit: Payer: Self-pay | Admitting: *Deleted

## 2014-03-20 MED ORDER — ICOSAPENT ETHYL 1 G PO CAPS: 2.0000 | ORAL_CAPSULE | Freq: Two times a day (BID) | ORAL | Status: DC

## 2014-03-20 NOTE — Telephone Encounter (Signed)
Refilled electronically 

## 2014-05-03 ENCOUNTER — Telehealth: Payer: Self-pay | Admitting: Cardiovascular Disease

## 2014-05-03 NOTE — Telephone Encounter (Signed)
Called office to speak to them, automated machine picked up stating Out of Office message, instructs to call after 2pm, unable to leave voice message.

## 2014-05-03 NOTE — Telephone Encounter (Signed)
Contacted PCP's office, informed of last labs we had pending orders for. They will draw these, George Mclaughlin stated no need to fax an order sheet, she will make sure these are drawn next week at pt's appt.  Called patient to communicate this, VM on phone is not set up, unable to leave message.

## 2014-05-03 NOTE — Telephone Encounter (Signed)
Loraine LericheMark is calling because he wants to have the lab work done at his primary care office and would an order for his labs to go over there. [place]. [place] # [phone] and ask for Jona. Please call    Thanks

## 2014-09-25 ENCOUNTER — Ambulatory Visit: Payer: Medicare Other | Admitting: Cardiovascular Disease

## 2014-10-08 ENCOUNTER — Encounter: Payer: Self-pay | Admitting: Cardiovascular Disease

## 2014-10-08 ENCOUNTER — Ambulatory Visit (INDEPENDENT_AMBULATORY_CARE_PROVIDER_SITE_OTHER): Payer: Medicare Other | Admitting: Cardiovascular Disease

## 2014-10-08 VITALS — BP 124/92 | HR 64 | Ht 69.0 in | Wt 214.5 lb

## 2014-10-08 DIAGNOSIS — N529 Male erectile dysfunction, unspecified: Secondary | ICD-10-CM | POA: Diagnosis not present

## 2014-10-08 DIAGNOSIS — Z8249 Family history of ischemic heart disease and other diseases of the circulatory system: Secondary | ICD-10-CM | POA: Diagnosis not present

## 2014-10-08 DIAGNOSIS — E782 Mixed hyperlipidemia: Secondary | ICD-10-CM

## 2014-10-08 DIAGNOSIS — Z720 Tobacco use: Secondary | ICD-10-CM | POA: Diagnosis not present

## 2014-10-08 NOTE — Progress Notes (Signed)
Patient ID: George Mclaughlin, male   DOB: 1961/04/13, 54 y.o.   MRN: [id number]     HPI: George Mclaughlin is a 54 y.o. male who presents to the office today for a 7 month cardiology evaluation.  Mr. Grand has a strong family history for CAD. He as a history of hyperlipidemia, tobacco use, as well as EtOH use drinking predominantly beer. In 2012 his cholesterol was 236, triglycerides 340, VLDL 68 and his LDL was 133 suggestive of an atherogenic dyslipidemia pattern. An echo Doppler study showed normal size and function with mild mitral and tricuspid regurgitation. A nuclear perfusion study for atypical chest pain was normal. He was initially started on Simcor 500/20.   He sees Dr. Hardin Negus for primary care and lab work by Dr. Hardin Negus  had shown a triglyceride level of 435.  When I saw him 2 years ago I added fenofibrate 145 mg  and also started him on Vascepa 2 capsules twice a day in light of his marked hypertriglyceridemia. I also recommended he take Crestor 10 mg daily.  Laboratory from 01/05/2013 was significantly improved and revealed a hemoglobin of 15.8 hematocrit 44.9; cholesterol 135, triglycerides 107. LDL calculated was 75 and LDL particle number by NMR was 1098. Insulin resistance score was elevated at 59.Small  LDL particle size improved to 416.  Last year, his LDL particle member had increased to 1498 from 1098. LDL calculated cholesterol had increased from 75-103. His LDL particle member had increased to 792. Fortunately his triglycerides still remain fairly normal at 133. Total cholesterol was 178. Insulin resistance score was 52.   Since I last saw him, he continues to smoke 2 cigarettes a day.  He remains relatively active but does not routinely exercise.  He does have difficulty with upper back discomfort.  He notes vague episodes of a chest sensation which is not exertional precipitation and feels it may be related to his back.  He also has had difficulty with his shoulder.  He denies  chest pain.  He is unaware of palpitations.  He has been taking Crestor 40 mg, the septa 2 capsules twice a day and TriCor 145 mg for his mixed hyperlipidemia.  He takes Nexium 20 mg for GERD.  He takes aprazalam as needed for anxiety.  He also takes Viagra as needed.  Past Medical History  Diagnosis Date  . Hyperlipidemia   . Kidney stones     Past Surgical History  Procedure Laterality Date  . Back surgery  1999 & 2000  . US echocardiography  05/26/2010    mild MR  . Nm myocar perf wall motion  05/26/2010    Normal    No Known Allergies  Current Outpatient Prescriptions  Medication Sig Dispense Refill  . ALPRAZolam (XANAX) 1 MG tablet 1 mg 3 (three) times daily as needed.     . cyclobenzaprine (FLEXERIL) 10 MG tablet 10 mg as needed.     Marland Kitchen esomeprazole (NEXIUM) 20 MG capsule Take 20 mg by mouth daily before breakfast. PRN    . fenofibrate (TRICOR) 145 MG tablet Take 1 tablet (145 mg total) by mouth daily. 90 tablet 3  . Icosapent Ethyl (VASCEPA) 1 G CAPS Take 2 capsules by mouth 2 (two) times daily. With food 120 capsule 6  . meloxicam (MOBIC) 7.5 MG tablet 7.5 mg as needed.     . Oxycodone HCl 10 MG TABS Take 10 mg by mouth. Prn    . rosuvastatin (CRESTOR) 40 MG tablet Take 1  tablet (40 mg total) by mouth daily. 90 tablet 3  . sildenafil (VIAGRA) 50 MG tablet Take 1/2 to 1 tablet as needed. 10 tablet 0   No current facility-administered medications for this visit.    Socially He is divorced. Previously, he used to drink several sixpacks of beer a week. He states he currently smokes at most 2 cigarettes on the days that he smokes but other days he does not smoke. He denies change in hearing. He denies change in weight. His other lymphadenopathy. He has 2 children. He is divorced. He takes care of his mother George Mclaughlin who is one of my patients.  ROS General: Negative; No fevers, chills, or night sweats;  HEENT: Negative; No changes in vision or hearing, sinus congestion,  difficulty swallowing Pulmonary: Negative; No cough, wheezing, shortness of breath, hemoptysis Cardiovascular: Negative; No chest pain, presyncope, syncope, palpitations GI: Negative; No nausea, vomiting, diarrhea, or abdominal pain GU: Mild erectile dysfunction; No dysuria, hematuria, or difficulty voiding Musculoskeletal: Occasional back discomfort; no myalgias, joint pain, or weakness Hematologic/Oncology: Negative; no easy bruising, bleeding Endocrine: Negative; no heat/cold intolerance; no diabetes Neuro: Negative; no changes in balance, headaches Skin: Negative; No rashes or skin lesions Psychiatric: Negative; No behavioral problems, depression Sleep: Negative; No snoring, daytime sleepiness, hypersomnolence, bruxism, restless legs, hypnogognic hallucinations, no cataplexy Other comprehensive 14 point system review is negative.   PE BP 124/92 mmHg  Pulse 64  Ht 5' 9"  (1.753 m)  Wt 214 lb 8 oz (97.297 kg)  BMI 31.66 kg/m2   Wt Readings from Last 3 Encounters:  10/08/14 214 lb 8 oz (97.297 kg)  03/06/14 207 lb (93.895 kg)  07/13/13 203 lb 4.8 oz (92.216 kg)   General: Alert, oriented, no distress.  Skin: normal turgor, no rashes HEENT: Normocephalic, atraumatic. Pupils round and reactive; sclera anicteric;no lid lag.  Nose without nasal septal hypertrophy Mouth/Parynx benign; Mallinpatti scale 3 Neck: No JVD, no carotid bruits with normal carotid upstroke no chest wall tenderness to palpation Chest wall: Nontender to palpation Lungs: clear to ausculatation and percussion; no wheezing or rales Heart: RRR, s1 s2 normal, 1/6 systolic murmur.  No diastolic murmur.  No rubs thrills or heaves.  No gallops. Abdomen: soft, nontender; no hepatosplenomehaly, BS+; abdominal aorta nontender and not dilated by palpation. Back: No CVA tenderness Pulses 2+ Extremities: no clubbing cyanosis or edema, Homan's sign negative  Neurologic: grossly nonfocal Psychological: Affect and  mood.  ECG (independently read by me): Normal sinus rhythm at 64 bpm.  Mild sinus arrhythmia.  Mild RV conduction delay.  Normal intervals  ECG (independently read by me): Normal sinus rhythm at 72.  Incomplete right bundle branch block.  No ectopy.  March 2015  ECG: Normal sinus rhythm at 80 beats per minute. Normal intervals. Nondiagnostic T changes in lead 3.  Prior 01/09/2013 ECG: Normal sinus rhythm at75 beats per minute. Nonspecific T. change.  LABS: BMP Latest Ref Rng 02/13/2014 12/29/2012 10/13/2012  Glucose 70 - 99 mg/dL 94 92 168(H)  BUN 6 - 23 mg/dL 13 9 15   Creatinine 0.50 - 1.35 mg/dL 0.95 0.99 1.25  Sodium 135 - 145 mEq/L 135 140 141  Potassium 3.5 - 5.3 mEq/L 4.2 3.9 3.7  Chloride 96 - 112 mEq/L 104 104 109(H)  CO2 19 - 32 mEq/L 22 29 26   Calcium 8.4 - 10.5 mg/dL 9.3 9.5 8.5   Hepatic Function Latest Ref Rng 02/13/2014 12/29/2012 10/13/2012  Total Protein 6.0 - 8.3 g/dL 6.9 6.7 6.2(L)  Albumin 3.5 -  5.2 g/dL 4.4 4.3 3.5  AST 0 - 37 U/L 25 32 30  ALT 0 - 53 U/L 29 36 46  Alk Phosphatase 39 - 117 U/L 60 56 69  Total Bilirubin 0.2 - 1.2 mg/dL 0.7 0.8 -   CBC Latest Ref Rng 02/13/2014 06/21/2013 12/29/2012  WBC 4.0 - 10.5 K/uL 6.1 5.7 5.3  Hemoglobin 13.0 - 17.0 g/dL 16.4 16.0 15.8  Hematocrit 39.0 - 52.0 % 45.2 44.5 44.9  Platelets 150 - 400 K/uL 186 175 189   Lab Results  Component Value Date   MCV 89.2 02/13/2014   MCV 90.1 06/21/2013   MCV 89.8 12/29/2012   Lab Results  Component Value Date   TSH 0.929 02/13/2014  No results found for: HGBA1C   Lipid Panel     Component Value Date/Time   CHOL 202* 02/13/2014 0846   TRIG 152* 02/13/2014 0846   HDL 41 02/13/2014 0846   LDLCALC 131* 02/13/2014 0846    BNP No results found for: PROBNP  NMR Lipid Panel from 2015:      Ref Range 3wk ago  75moago  126yrgo     LDL Particle Number <1000 nmol/L 1647 (H) 1498 (H)CM 1098 (H)CM   Comments:             Low          < 1000              Moderate     1000 - 1299             Borderline-High 1300 - 1599             High       1600 - 2000             Very High       > 2000    LDL (calc) 0 - 99 mg/dL 131 (H) 103 (H)R, CM 75R, CM   Comments:             Optimal        < 100             Above optimal   100 - 129             Borderline    130 - 159             High       160 - 189             Very high       > 189       **Please note reference interval change** LDL-C is inaccurate if patient is non-fasting.    HDL-C >39 mg/dL 41 =40 mg/dL" class="rz_1a" style="cursor: pointer;" onmouseover='jscript: var varStyle="underline"; var bgColor="#[id number]"; this.style.backgroundColor=bgColor; var children=this.getElementsByTagName("div"); for(var child=0;child 48R =40 mg/dL" class="rz_1b" style="cursor: pointer;" onmouseover='jscript: var varStyle="underline"; var bgColor="#[email or web address]"; this.style.backgroundColor=bgColor; var children=this.getElementsByTagName("div"); for(var child=0;child 39 (L)R    Triglycerides 0 - 149 mg/dL 152 (H) 133R 107R   Comments:       **Please note reference interval change**    Cholesterol, Total 100 - 199 mg/dL 202 (H) 178R 135R   Comments:       **Please note reference interval change**    HDL Particle Number >=30.5 umol/L 28.5 (L) =30.5 umol/L" class="rz_1a" style="cursor: pointer;" onmouseover='jscript: var varStyle="underline"; var bgColor="#[email or web address]"; this.style.backgroundColor=bgColor; var children=this.getElementsByTagName("div"); for(var child=0;child 31.8 =30.5 umol/L" class="rz_1b" style="cursor: pointer;" onmouseover='jscript: var varStyle="underline"; var bgColor="#[email or web address]"; this.style.backgroundColor=bgColor; var children=this.getElementsByTagName("div"); for(var child=0;child 29.6 (L)    Large HDL-P  >=  4.8 umol/L 1.7 (L) =4.8 umol/L" class="rz_1a" style="cursor: pointer;" onmouseover='jscript: var varStyle="underline"; var bgColor="#[email or web address]"; this.style.backgroundColor=bgColor; var children=this.getElementsByTagName("div"); for(var child=0;child 1.3 (L) =4.8 umol/L" class="rz_1b" style="cursor: pointer;" onmouseover='jscript: var varStyle="underline"; var bgColor="#[email or web address]"; this.style.backgroundColor=bgColor; var children=this.getElementsByTagName("div"); for(var child=0;child 2.2 (L)    Large VLDL-P <=2.7 nmol/L 4.8 (H) 1.2 1.6    Small LDL Particle Number <=527 nmol/L 599 (H) 792 (H) 416    LDL Size >=20.8 nm 21.0 20.5 nm" class="rz_1a" style="cursor: pointer;" onmouseover='jscript: var varStyle="underline"; var bgColor="#[email or web address]"; this.style.backgroundColor=bgColor; var children=this.getElementsByTagName("div"); for(var child=0;child 20.9R 20.5 nm" class="rz_1b" style="cursor: pointer;" onmouseover='jscript: var varStyle="underline"; var bgColor="#[email or web address]"; this.style.backgroundColor=bgColor; var children=this.getElementsByTagName("div"); for(var child=0;child 20.6R    HDL Size >=9.2 nm 8.5 (L) =9.2 nm" class="rz_1a" style="cursor: pointer;" onmouseover='jscript: var varStyle="underline"; var bgColor="#[email or web address]"; this.style.backgroundColor=bgColor; var children=this.getElementsByTagName("div"); for(var child=0;child 8.4 (L) =9.2 nm" class="rz_1b" style="cursor: pointer; background-color: rgb(222, 231, 239);" onmouseover='jscript: var varStyle="underline"; var bgColor="#[email or web address]"; this.style.backgroundColor=bgColor; var children=this.getElementsByTagName("div"); for(var child=0;child 8.5 (L)    VLDL Size <=46.6 nm 44.3 41.7 44.1    LP-IR Score <=45  67 (H) 52 (H)CM 59 (H)CM   Comments: ----------------------------------------------------------       INSULIN RESISTANCE / DIABETES RISK MARKERS      <--Insulin Sensitive   Insulin Resistant-->           Percentile in  Reference Population Large VLDL-P   Low   25th   50th   75th   High          <0.9  0.9   2.7   6.9   >6.9 Small LDL-P    Low   25th   50th   75th   High          <117  117   527   839   >839 Large HDL-P    High  75th   50th   25th   Low          >7.3  7.3   4.8   3.1   <3.1 VLDL Size     Small  25th   50th   75th   Large          <42.4  42.4   46.6   52.5   >52.5 LDL Size     Large  75th   50th   25th   Small          >21.2  21.2   20.8   20.4   <20.4 HDL Size     Large  75th   50th   25th   Small          >9.6  9.6   9.2   8.9   <8.9 Insulin Resistance Score LP-IR SCORE    Low   25th   50th   75th   High          <27   27    45    63    >63       RADIOLOGY: No results found.    ASSESSMENT AND PLAN: Mr. Jeananne Rama is a 54 year old gentleman with a very strong family history for CAD in both parents, significant hyperlipidemia, and ongoing tobacco use.  He now has been on a fairly maximal lipid-lowering regimen and is tolerating this well.  His blood pressure today is stable at 120/82.  When rechecked by me.  He is still smoking 2 cigarettes per day.  I discussed absolute discontinuance of all tobacco.  He has not had recent laboratory.  He tells me that Dr. Laurian Brim had recently checked a complete set  of blood work in the fasting state.  We have called their office to try to obtain these results for my review.  He experiences nonexertional vague chest discomfort which I do not feel is ischemic in etiology.  He continues to have issues with his upper back discomfort and left shoulder.  He takes meloxicam as needed for his back discomfort.  His GERD is controlled with Nexium.  I will review laboratory once available.  I will see him in one year for  reevaluation.  Time spent: 25 minute   Troy Sine, MD, [facility]  10/08/2014 7:10 PM

## 2014-10-08 NOTE — Patient Instructions (Signed)
Your physician wants you to follow-up in: 1 year or sooner if needed. You will receive a reminder letter in the mail two months in advance. If you don't receive a letter, please call our office to schedule the follow-up appointment.  

## 2014-12-19 ENCOUNTER — Other Ambulatory Visit: Payer: Self-pay | Admitting: *Deleted

## 2014-12-19 MED ORDER — ICOSAPENT ETHYL 1 G PO CAPS: 2.0000 | ORAL_CAPSULE | Freq: Two times a day (BID) | ORAL | Status: DC

## 2015-03-22 ENCOUNTER — Other Ambulatory Visit: Payer: Self-pay | Admitting: *Deleted

## 2015-03-22 MED ORDER — ROSUVASTATIN CALCIUM 40 MG PO TABS: 40.0000 mg | ORAL_TABLET | Freq: Every day | ORAL | Status: DC

## 2015-05-27 ENCOUNTER — Other Ambulatory Visit: Payer: Self-pay | Admitting: Cardiovascular Disease

## 2015-05-27 MED ORDER — FENOFIBRATE 145 MG PO TABS: 145.0000 mg | ORAL_TABLET | Freq: Every day | ORAL | Status: DC

## 2015-07-01 ENCOUNTER — Encounter: Payer: Self-pay | Admitting: Family Medicine

## 2015-07-01 ENCOUNTER — Other Ambulatory Visit: Payer: Self-pay | Admitting: Family Medicine

## 2015-07-01 NOTE — Telephone Encounter (Signed)
No record of office visit since on Epic.  Refills denied on Flexeril and Mobic.

## 2015-07-05 ENCOUNTER — Encounter: Payer: Self-pay | Admitting: Family Medicine

## 2015-07-05 DIAGNOSIS — F419 Anxiety disorder, unspecified: Secondary | ICD-10-CM | POA: Insufficient documentation

## 2015-07-15 ENCOUNTER — Ambulatory Visit (INDEPENDENT_AMBULATORY_CARE_PROVIDER_SITE_OTHER): Payer: Medicare Other | Admitting: Family Medicine

## 2015-07-15 ENCOUNTER — Encounter: Payer: Self-pay | Admitting: Family Medicine

## 2015-07-15 VITALS — BP 140/92 | HR 76 | Temp 98.2°F | Resp 18 | Ht 69.5 in | Wt 210.0 lb

## 2015-07-15 DIAGNOSIS — G8929 Other chronic pain: Secondary | ICD-10-CM | POA: Diagnosis not present

## 2015-07-15 DIAGNOSIS — E785 Hyperlipidemia, unspecified: Secondary | ICD-10-CM

## 2015-07-15 DIAGNOSIS — E781 Pure hyperglyceridemia: Secondary | ICD-10-CM

## 2015-07-15 DIAGNOSIS — M545 Low back pain: Secondary | ICD-10-CM

## 2015-07-15 DIAGNOSIS — F17200 Nicotine dependence, unspecified, uncomplicated: Secondary | ICD-10-CM | POA: Insufficient documentation

## 2015-07-15 DIAGNOSIS — Z7689 Persons encountering health services in other specified circumstances: Secondary | ICD-10-CM

## 2015-07-15 DIAGNOSIS — F411 Generalized anxiety disorder: Secondary | ICD-10-CM

## 2015-07-15 DIAGNOSIS — Z0000 Encounter for general adult medical examination without abnormal findings: Secondary | ICD-10-CM

## 2015-07-15 MED ORDER — CYCLOBENZAPRINE HCL 10 MG PO TABS: 10.0000 mg | ORAL_TABLET | Freq: Three times a day (TID) | ORAL | Status: DC | PRN

## 2015-07-15 MED ORDER — MELOXICAM 7.5 MG PO TABS: 7.5000 mg | ORAL_TABLET | ORAL | Status: DC | PRN

## 2015-07-15 MED ORDER — ALPRAZOLAM 1 MG PO TABS: 1.0000 mg | ORAL_TABLET | Freq: Three times a day (TID) | ORAL | Status: DC | PRN

## 2015-07-15 NOTE — Progress Notes (Signed)
Subjective:    Patient ID: George Mclaughlin, male    DOB: 01/22/1961, 55 y.o.   MRN: [id number]  HPI Patient is a 55 year old white male here today to establish care. Past medical history is significant for chronic low back pain. Patient has had 2 separate back surgeries. The first was in 1999 and the second was in 2000. He underwent laminectomy and discectomy at L4, L5, and S1. At the time he was experiencing symptoms of cauda equina syndrome with weakness and semi-paralysis in his legs. The surgery was performed by Dr. Senaida Ores at [facility]. Since that time the strength has returned to his legs. He denies any numbness and tingling but he does have chronic low back pain. He takes oxycodone 1-2 tablets a day as needed. He also reports spells that he gets once a month where he will develop muscle spasms in his lower back. This tends to respond better to Flexeril and meloxicam. He tries to walk on a daily basis and sometimes walks up to 5 miles a day. Unfortunately he smokes. His last colonoscopy was 2 years ago and was normal. He is due for prostate exam. He is also due for a pneumonia shot as well as a flu shot but he refuses all vaccinations. He also has a 35 year history of generalized anxiety disorder. Patient states that he started having panic attacks when he was in high school. He is immediately placed on Xanax which worked well. He takes 1 mg 4 times a day. He states it has been years since he had a panic attack however he does not take the medication he experiences withdrawal type symptoms. He is interested on trying to get off the medication if possible although he is very afraid of trying to stop it. Past Medical History  Diagnosis Date  . Hyperlipidemia   . Anxiety   . Smoker    Past Surgical History  Procedure Laterality Date  . Back surgery  1999 & 2000    laminectomy and discectomy L4, L5, S1 (Dr. Senaida Ores at [facility])  . US echocardiography  05/26/2010    mild MR  . Nm myocar perf wall  motion  05/26/2010    Normal  . Spine surgery     Current Outpatient Prescriptions on File Prior to Visit  Medication Sig Dispense Refill  . esomeprazole (NEXIUM) 20 MG capsule Take 20 mg by mouth daily before breakfast. PRN    . fenofibrate (TRICOR) 145 MG tablet Take 1 tablet (145 mg total) by mouth daily. 90 tablet 1  . Icosapent Ethyl (VASCEPA) 1 G CAPS Take 2 capsules by mouth 2 (two) times daily. With food 120 capsule 8  . Oxycodone HCl 10 MG TABS Take 10 mg by mouth. Prn    . rosuvastatin (CRESTOR) 40 MG tablet Take 1 tablet (40 mg total) by mouth daily. 90 tablet 3  . sildenafil (VIAGRA) 50 MG tablet Take 1/2 to 1 tablet as needed. 10 tablet 0   No current facility-administered medications on file prior to visit.   No Known Allergies Social History   Social History  . Marital Status: Divorced    Spouse Name: N/A  . Number of Children: N/A  . Years of Education: N/A   Occupational History  . Not on file.   Social History Main Topics  . Smoking status: Current Some Day Smoker    Types: Cigarettes  . Smokeless tobacco: Never Used     Comment: smoke about 3 cigarettes daily  .  Alcohol Use: 3.6 - 7.2 oz/week    6-12 Standard drinks or equivalent per week  . Drug Use: No     Comment: used "pot" 25 years ago.  Marland Kitchen. Sexual Activity: Not on file   Other Topics Concern  . Not on file   Social History Narrative   Family History  Problem Relation Age of Onset  . Heart disease Mother   . Kidney Stones Mother   . Breast cancer Mother   . Heart disease Father   . Heart disease Maternal Grandmother       Review of Systems  All other systems reviewed and are negative.      Objective:   Physical Exam  Constitutional: He is oriented to person, place, and time. He appears well-developed and well-nourished. No distress.  HENT:  Head: Normocephalic and atraumatic.  Right Ear: External ear normal.  Left Ear: External ear normal.  Nose: Nose normal.  Mouth/Throat:  Oropharynx is clear and moist. No oropharyngeal exudate.  Eyes: Conjunctivae and EOM are normal. Pupils are equal, round, and reactive to light. Right eye exhibits no discharge. Left eye exhibits no discharge. No scleral icterus.  Neck: Normal range of motion. Neck supple. No JVD present. No tracheal deviation present. No thyromegaly present.  Cardiovascular: Normal rate, regular rhythm, normal heart sounds and intact distal pulses.  Exam reveals no gallop and no friction rub.   No murmur heard. Pulmonary/Chest: Effort normal and breath sounds normal. No stridor. No respiratory distress. He has no wheezes. He has no rales. He exhibits no tenderness.  Abdominal: Soft. Bowel sounds are normal. He exhibits no distension and no mass. There is no tenderness. There is no rebound and no guarding.  Genitourinary: Rectum normal and prostate normal.  Musculoskeletal: Normal range of motion. He exhibits no edema or tenderness.  Lymphadenopathy:    He has no cervical adenopathy.  Neurological: He is alert and oriented to person, place, and time. He has normal reflexes. He displays normal reflexes. No cranial nerve deficit. He exhibits normal muscle tone. Coordination normal.  Skin: Skin is warm. No rash noted. He is not diaphoretic. No erythema. No pallor.  Psychiatric: He has a normal mood and affect. His behavior is normal. Judgment and thought content normal.  Vitals reviewed.         Assessment & Plan:  Routine general medical examination at a health care facility  Establishing care with new doctor, encounter for  HLD (hyperlipidemia)  Hypertriglyceridemia  Chronic low back pain  Generalized anxiety disorder  Physical exam today is normal. His blood pressures borderline but he is a little bit anxious and a new doctor's office. I would like him to return fasting so that I can check a CBC, CMP, fasting lipid panel, PSA. He is also due for a one-time screening for hepatitis C given his  birthday. His goal LDL cholesterol of less than 130. Ideally I would like his LDL cholesterol be below 100. Colonoscopy is up-to-date. He declines vaccinations. I will consent to 1-2 oxycodone 10 mg tablets per day. I will provide him 60 tablets with the hope that this can last upwards of 2 months. I did recommend we gradually start weaning back on Xanax. I believe majority of what he is treating now could be withdrawal symptoms from his chronic use of this medication. I recommend decreasing Xanax from 4 tablets a day to 3 tablets a day. Over time we will try to decrease the frequency and the strength but I would  do this gradually given the fact he has been on the medication for 35 years. I did refill his meloxicam and Flexeril for his chronic low back pain.

## 2015-08-02 ENCOUNTER — Other Ambulatory Visit: Payer: Medicare Other

## 2015-08-02 LAB — CBC WITH DIFFERENTIAL/PLATELET
Basophils Absolute: 0 cells/uL (ref 0–200)
Basophils Relative: 0 %
EOS PCT: 3 %
Eosinophils Absolute: 180 cells/uL (ref 15–500)
HCT: 47.5 % (ref 38.5–50.0)
HEMOGLOBIN: 16.3 g/dL (ref 13.0–17.0)
LYMPHS ABS: 2340 {cells}/uL (ref 850–3900)
LYMPHS PCT: 39 %
MCH: 31.8 pg (ref 27.0–33.0)
MCHC: 34.3 g/dL (ref 32.0–36.0)
MCV: 92.6 fL (ref 80.0–100.0)
MONOS PCT: 9 %
MPV: 9.8 fL (ref 7.5–12.5)
Monocytes Absolute: 540 cells/uL (ref 200–950)
NEUTROS PCT: 49 %
Neutro Abs: 2940 cells/uL (ref 1500–7800)
PLATELETS: 166 10*3/uL (ref 140–400)
RBC: 5.13 MIL/uL (ref 4.20–5.80)
RDW: 13.4 % (ref 11.0–15.0)
WBC: 6 10*3/uL (ref 3.8–10.8)

## 2015-08-02 LAB — COMPLETE METABOLIC PANEL WITH GFR
ALT: 39 U/L (ref 9–46)
AST: 28 U/L (ref 10–35)
Albumin: 4.7 g/dL (ref 3.6–5.1)
Alkaline Phosphatase: 62 U/L (ref 40–115)
BUN: 13 mg/dL (ref 7–25)
CHLORIDE: 107 mmol/L (ref 98–110)
CO2: 25 mmol/L (ref 20–31)
Calcium: 9.6 mg/dL (ref 8.6–10.3)
Creat: 0.88 mg/dL (ref 0.70–1.33)
GFR, Est African American: >89 mL/min (ref >=60)
GFR, Est Non African American: >89 mL/min (ref >=60)
GLUCOSE: 92 mg/dL (ref 70–99)
POTASSIUM: 4.4 mmol/L (ref 3.5–5.3)
SODIUM: 140 mmol/L (ref 135–146)
Total Bilirubin: 0.7 mg/dL (ref 0.2–1.2)
Total Protein: 7 g/dL (ref 6.1–8.1)

## 2015-08-02 LAB — LIPID PANEL
CHOL/HDL RATIO: 4.5 ratio (ref <=5.0)
Cholesterol: 165 mg/dL (ref 125–200)
HDL: 37 mg/dL — AB (ref >=40)
LDL CALC: 100 mg/dL (ref <130)
Triglycerides: 141 mg/dL (ref <150)
VLDL: 28 mg/dL (ref <30)

## 2015-08-02 LAB — HEPATITIS C ANTIBODY: HCV AB: NEGATIVE

## 2015-08-03 LAB — PSA: PSA: 1.14 ng/mL (ref <=4.00)

## 2015-08-05 ENCOUNTER — Encounter: Payer: Self-pay | Admitting: Family Medicine

## 2015-08-27 ENCOUNTER — Other Ambulatory Visit: Payer: Self-pay | Admitting: Family Medicine

## 2015-08-27 NOTE — Telephone Encounter (Signed)
ok 

## 2015-08-27 NOTE — Telephone Encounter (Signed)
Prescription sent to pharmacy.

## 2015-08-27 NOTE — Telephone Encounter (Signed)
Ok to refill 

## 2015-09-24 ENCOUNTER — Other Ambulatory Visit: Payer: Self-pay | Admitting: Family Medicine

## 2015-09-24 NOTE — Telephone Encounter (Signed)
Flexeril called into [facility] [facility].

## 2015-09-24 NOTE — Telephone Encounter (Signed)
Okay to refill? 

## 2015-09-24 NOTE — Telephone Encounter (Signed)
Flexeril refill request.  Last seen 07/15/2015, last filled 08/27/2015.  Please advise.

## 2015-09-25 ENCOUNTER — Other Ambulatory Visit: Payer: Self-pay

## 2015-09-25 MED ORDER — ICOSAPENT ETHYL 1 G PO CAPS: 2.0000 | ORAL_CAPSULE | Freq: Two times a day (BID) | ORAL | Status: DC

## 2015-10-09 ENCOUNTER — Other Ambulatory Visit: Payer: Self-pay | Admitting: Family Medicine

## 2015-10-09 NOTE — Telephone Encounter (Signed)
Ok to refill??  Last office visit/ refill 07/15/2015.

## 2015-10-09 NOTE — Telephone Encounter (Signed)
Ok to refill 

## 2015-10-10 NOTE — Telephone Encounter (Signed)
ok 

## 2015-10-10 NOTE — Telephone Encounter (Signed)
Prescription sent to pharmacy.

## 2015-10-11 NOTE — Telephone Encounter (Signed)
Medication called to pharmacy. 

## 2015-10-25 ENCOUNTER — Other Ambulatory Visit: Payer: Self-pay | Admitting: Family Medicine

## 2015-10-25 NOTE — Telephone Encounter (Signed)
Refill appropriate and filled per protocol. 

## 2015-11-12 ENCOUNTER — Emergency Department
Admission: EM | Admit: 2015-11-12 | Discharge: 2015-11-12 | Disposition: A | Discharge status: Home / Self Care | Payer: Medicare Other | Attending: Emergency Medicine | Admitting: Emergency Medicine

## 2015-11-12 ENCOUNTER — Encounter: Payer: Self-pay | Admitting: Emergency Medicine

## 2015-11-12 DIAGNOSIS — Y92814 Boat as the place of occurrence of the external cause: Secondary | ICD-10-CM | POA: Insufficient documentation

## 2015-11-12 DIAGNOSIS — F17210 Nicotine dependence, cigarettes, uncomplicated: Secondary | ICD-10-CM | POA: Diagnosis not present

## 2015-11-12 DIAGNOSIS — W230XXA Caught, crushed, jammed, or pinched between moving objects, initial encounter: Secondary | ICD-10-CM | POA: Diagnosis not present

## 2015-11-12 DIAGNOSIS — Y999 Unspecified external cause status: Secondary | ICD-10-CM | POA: Diagnosis not present

## 2015-11-12 DIAGNOSIS — E785 Hyperlipidemia, unspecified: Secondary | ICD-10-CM | POA: Insufficient documentation

## 2015-11-12 DIAGNOSIS — Y9389 Activity, other specified: Secondary | ICD-10-CM | POA: Insufficient documentation

## 2015-11-12 DIAGNOSIS — S61214A Laceration without foreign body of right ring finger without damage to nail, initial encounter: Secondary | ICD-10-CM | POA: Diagnosis not present

## 2015-11-12 DIAGNOSIS — Z79899 Other long term (current) drug therapy: Secondary | ICD-10-CM | POA: Diagnosis not present

## 2015-11-12 DIAGNOSIS — S61219A Laceration without foreign body of unspecified finger without damage to nail, initial encounter: Secondary | ICD-10-CM

## 2015-11-12 MED ORDER — SULFAMETHOXAZOLE-TRIMETHOPRIM 800-160 MG PO TABS
1.0000 | ORAL_TABLET | Freq: Once | ORAL | Status: AC
  Administered 2015-11-12: 1 via ORAL
  Filled 2015-11-12: qty 1

## 2015-11-12 MED ORDER — HYDROMORPHONE HCL 1 MG/ML IJ SOLN
1.0000 mg | Freq: Once | INTRAMUSCULAR | Status: AC
  Administered 2015-11-12: 1 mg via INTRAMUSCULAR
  Filled 2015-11-12: qty 1

## 2015-11-12 MED ORDER — SULFAMETHOXAZOLE-TRIMETHOPRIM 800-160 MG PO TABS: 1.0000 | ORAL_TABLET | Freq: Two times a day (BID) | ORAL | 0 refills | Status: DC

## 2015-11-12 MED ORDER — OXYCODONE-ACETAMINOPHEN 7.5-325 MG PO TABS: 1.0000 | ORAL_TABLET | ORAL | 0 refills | Status: DC | PRN

## 2015-11-12 MED ORDER — LIDOCAINE HCL (PF) 1 % IJ SOLN
INTRAMUSCULAR | Status: AC
  Administered 2015-11-12: 1 mL
  Filled 2015-11-12: qty 5

## 2015-11-12 NOTE — ED Provider Notes (Signed)
[facility] Emergency Department Provider Note   ____________________________________________  Time seen: Approximately 10:03 PM  I have reviewed the triage vital signs and the nursing notes.   HISTORY  Chief Complaint Laceration    HPI George Mclaughlin is a 55 y.o. male complain of a crush injury to the fourth digit right dominant hand. Patient state he has a partial avulsion of the distal right fourth finger which according the pulley on his boat. Patient denies loss of function or sensation. Patient rates his pain as a 2/10. Pressure dressing applied palpation prior to arrival. No other palliative measures for this complaint.  Patient states tetanus shot is up-to-date.   Past Medical History:  Diagnosis Date  . Anxiety   . Hyperlipidemia   . Smoker     Patient Active Problem List   Diagnosis Date Noted  . Smoker   . Anxiety   . Erectile dysfunction 01/09/2013  . Mixed hyperlipidemia 11/03/2012  . Tobacco use 11/03/2012  . Family history of premature CAD 11/03/2012    Past Surgical History:  Procedure Laterality Date  . BACK SURGERY  1999 & 2000   laminectomy and discectomy L4, L5, S1 (Dr. Senaida Ores at [facility])  . NM MYOCAR PERF WALL MOTION  05/26/2010   Normal  . SPINE SURGERY    . US ECHOCARDIOGRAPHY  05/26/2010   mild MR    Current Outpatient Rx  . Order #: [id number] Class: Phone In  . Order #: [id number] Class: Normal  . Order #: [id number] Class: Normal  . Order #: [id number] Class: Normal  . Order #: [id number] Class: Normal  . Order #: [id number] Class: Normal  . Order #: [id number] Class: Historical Med  . Order #: [id number] Class: Print  . Order #: [id number] Class: Normal  . Order #: [id number] Class: Normal  . Order #: [id number] Class: Print    Allergies Review of patient's allergies indicates no known allergies.  Family History  Problem Relation Age of Onset  . Heart disease Mother   . Kidney Stones Mother   . Breast cancer Mother     . Heart disease Father   . Heart disease Maternal Grandmother     Social History Social History  Substance Use Topics  . Smoking status: Current Some Day Smoker    Types: Cigarettes  . Smokeless tobacco: Never Used     Comment: smoke about 3 cigarettes daily  . Alcohol use 3.6 - 7.2 oz/week    6 - 12 Standard drinks or equivalent per week    Review of Systems Constitutional: No fever/chills Eyes: No visual changes. ENT: No sore throat. Cardiovascular: Denies chest pain. Respiratory: Denies shortness of breath. Gastrointestinal: No abdominal pain.  No nausea, no vomiting.  No diarrhea.  No constipation. Genitourinary: Negative for dysuria. Musculoskeletal: Negative for back pain. Skin: Negative for rash.Laceration distal fourth right finger. Neurological: Negative for headaches, focal weakness or numbness. Psychiatric:Anxiety Endocrine:Hyperlipidemia ____________________________________________   PHYSICAL EXAM:  VITAL SIGNS: ED Triage Vitals  Enc Vitals Group     BP 11/12/15 2135 122/76     Pulse Rate 11/12/15 2135 89     Resp 11/12/15 2135 18     Temp 11/12/15 2135 98.4 F (36.9 C)     Temp Source 11/12/15 2135 Oral     SpO2 11/12/15 2135 100 %     Weight 11/12/15 2135 187 lb (84.8 kg)     Height 11/12/15 2135  (1.803 m)     Head Circumference --      Peak Flow --  Pain Score 11/12/15 2153 2     Pain Loc --      Pain Edu? --      Excl. in GC? --     Constitutional: Alert and oriented. Well appearing and in no acute distress. Eyes: Conjunctivae are normal. PERRL. EOMI. Head: Atraumatic. Nose: No congestion/rhinnorhea. Mouth/Throat: Mucous membranes are moist.  Oropharynx non-erythematous. Neck: No stridor.  No cervical spine tenderness to palpation. Hematological/Lymphatic/Immunilogical: No cervical lymphadenopathy. Cardiovascular: Normal rate, regular rhythm. Grossly normal heart sounds.  Good peripheral circulation. Respiratory: Normal  respiratory effort.  No retractions. Lungs CTAB. Gastrointestinal: Soft and nontender. No distention. No abdominal bruits. No CVA tenderness. Musculoskeletal: No lower extremity tenderness nor edema.  No joint effusions. Neurologic:  Normal speech and language. No gross focal neurologic deficits are appreciated. No gait instability. Skin:  Skin is warm, dry and intact. No rash noted. Psychiatric: Mood and affect are normal. Speech and behavior are normal.  ____________________________________________   LABS (all labs ordered are listed, but only abnormal results are displayed)  Labs Reviewed - No data to display ____________________________________________  EKG   ____________________________________________  RADIOLOGY   ____________________________________________   PROCEDURES  Procedure(s) performed:LACERATION REPAIR Performed by: Joni Reining Authorized by: Joni Reining Consent: Verbal consent obtained. Risks and benefits: risks, benefits and alternatives were discussed Consent given by: patient Patient identity confirmed: provided demographic data Prepped and Draped in normal sterile fashion Wound explored  Laceration Location: Distal fourth finger right hand Laceration Length: 1cm  No Foreign Bodies seen or palpated  Anesthesia: Gen. blood   Local anesthetic: lidocaine 1% without epinephrine  Anesthetic total: 5 ml  Irrigation method: syringe Amount of cleaning: standard  Skin closure: 3-0 Number of sutures: Nylon Technique: Interrupted  Patient tolerance: Patient tolerated the procedure well with no immediate complications.   Procedures  Critical Care performed: No  ____________________________________________   INITIAL IMPRESSION / ASSESSMENT AND PLAN / ED COURSE  Pertinent labs & imaging results that were available during my care of the patient were reviewed by me and considered in my medical decision making (see chart for  details).  Right finger laceration. Patient given discharge care instructions. Patient given a prescription for Bactrim and Percocets. Patient advised to have sutures removed in 10 days by his family doctor or this department.  Clinical Course     ____________________________________________   FINAL CLINICAL IMPRESSION(S) / ED DIAGNOSES  Final diagnoses:  Finger laceration, initial encounter      NEW MEDICATIONS STARTED DURING THIS VISIT:  New Prescriptions   OXYCODONE-ACETAMINOPHEN (PERCOCET) 7.5-325 MG TABLET    Take 1 tablet by mouth every 4 (four) hours as needed for severe pain.   SULFAMETHOXAZOLE-TRIMETHOPRIM (BACTRIM DS,SEPTRA DS) 800-160 MG TABLET    Take 1 tablet by mouth 2 (two) times daily.     Note:  This document was prepared using Dragon voice recognition software and may include unintentional dictation errors.    Joni Reining, PA-C 11/12/15 2231    Myrna Blazer, MD 11/12/15 [phone]

## 2015-11-12 NOTE — ED Triage Notes (Addendum)
Pt in with partial avulsion of the tip of right 4th finger while working with pulley a boat.

## 2015-11-13 ENCOUNTER — Telehealth: Payer: Self-pay | Admitting: *Deleted

## 2015-11-13 NOTE — Telephone Encounter (Signed)
Received fax requesting refill on Xanax.   Ok to refill??  Last office visit 07/15/2015.  Last refill 10/11/2015.

## 2015-11-14 MED ORDER — ALPRAZOLAM 1 MG PO TABS: 1.0000 mg | ORAL_TABLET | Freq: Three times a day (TID) | ORAL | 0 refills | Status: DC | PRN

## 2015-11-14 NOTE — Telephone Encounter (Signed)
ok 

## 2015-11-14 NOTE — Telephone Encounter (Signed)
Medication called to pharmacy. 

## 2015-11-22 ENCOUNTER — Encounter: Payer: Self-pay | Admitting: Family Medicine

## 2015-11-22 ENCOUNTER — Ambulatory Visit (INDEPENDENT_AMBULATORY_CARE_PROVIDER_SITE_OTHER): Payer: Medicare Other | Admitting: Family Medicine

## 2015-11-22 VITALS — BP 130/82 | HR 72 | Temp 98.2°F | Resp 16 | Ht 69.0 in | Wt 192.0 lb

## 2015-11-22 DIAGNOSIS — Z4802 Encounter for removal of sutures: Secondary | ICD-10-CM

## 2015-11-22 NOTE — Progress Notes (Signed)
Subjective:    Patient ID: George Mclaughlin, male    DOB: [birth year], 55 y.o.   MRN: [id number]  HPI  10 days ago, the patient lacerated the tip of his right ring finger working on a tractor when the mother turned over. He sustained horizontal laceration completely severing the tip of his finger just distal to the fingernail. In the emergency room they were we able to reattach the tip as it was not completely cut off using 5 sutures, 3 of which sutured directly through the fingernail.  He is here today for suture removal Past Medical History:  Diagnosis Date  . Anxiety   . Hyperlipidemia   . Smoker    Past Surgical History:  Procedure Laterality Date  . BACK SURGERY  1999 & 2000   laminectomy and discectomy L4, L5, S1 (Dr. Senaida Ores at [facility])  . NM MYOCAR PERF WALL MOTION  05/26/2010   Normal  . SPINE SURGERY    . US ECHOCARDIOGRAPHY  05/26/2010   mild MR   Current Outpatient Prescriptions on File Prior to Visit  Medication Sig Dispense Refill  . ALPRAZolam (XANAX) 1 MG tablet Take 1 tablet (1 mg total) by mouth 3 (three) times daily as needed. 90 tablet 0  . cyclobenzaprine (FLEXERIL) 10 MG tablet TAKE 1 TABLET BY MOUTH 3 TIMES A DAY AS NEEDED 30 tablet 0  . esomeprazole (NEXIUM) 40 MG capsule TAKE 1 CAPSULE BY MOUTH ONCE DAILY 30 capsule 6  . fenofibrate (TRICOR) 145 MG tablet Take 1 tablet (145 mg total) by mouth daily. 90 tablet 1  . Icosapent Ethyl (VASCEPA) 1 g CAPS Take 2 capsules by mouth 2 (two) times daily. With food 120 capsule 2  . meloxicam (MOBIC) 7.5 MG tablet Take 1 tablet (7.5 mg total) by mouth as needed. 30 tablet 3  . Oxycodone HCl 10 MG TABS Take 10 mg by mouth. Prn    . oxyCODONE-acetaminophen (PERCOCET) 7.5-325 MG tablet Take 1 tablet by mouth every 4 (four) hours as needed for severe pain. 20 tablet 0  . rosuvastatin (CRESTOR) 40 MG tablet Take 1 tablet (40 mg total) by mouth daily. 90 tablet 3  . sildenafil (VIAGRA) 50 MG tablet Take 1/2 to 1 tablet as needed.  10 tablet 0  . sulfamethoxazole-trimethoprim (BACTRIM DS,SEPTRA DS) 800-160 MG tablet Take 1 tablet by mouth 2 (two) times daily. 20 tablet 0   No current facility-administered medications on file prior to visit.    No Known Allergies Social History   Social History  . Marital status: Divorced    Spouse name: N/A  . Number of children: N/A  . Years of education: N/A   Occupational History  . Not on file.   Social History Main Topics  . Smoking status: Current Some Day Smoker    Types: Cigarettes  . Smokeless tobacco: Never Used     Comment: smoke about 3 cigarettes daily  . Alcohol use 3.6 - 7.2 oz/week    6 - 12 Standard drinks or equivalent per week  . Drug use: No     Comment: used "pot" 25 years ago.  Marland Kitchen Sexual activity: Not on file   Other Topics Concern  . Not on file   Social History Narrative  . No narrative on file     Review of Systems  All other systems reviewed and are negative.      Objective:   Physical Exam  Cardiovascular: Normal rate, regular rhythm and normal heart sounds.  Pulmonary/Chest: Effort normal and breath sounds normal.  Vitals reviewed.  See HPI       Assessment & Plan:  Visit for suture removal  5 sutures removed without difficulty. Wound reinforced with Steri-Strips. The patient was given a stacked aluminum splint to protect it against avulsion of the tip accidentally until the wound completely heals

## 2015-12-10 ENCOUNTER — Telehealth: Payer: Self-pay | Admitting: Family Medicine

## 2015-12-10 NOTE — Telephone Encounter (Signed)
Requesting a refill on Xanax - Ok to refill??       

## 2015-12-10 NOTE — Telephone Encounter (Signed)
ok 

## 2015-12-11 ENCOUNTER — Other Ambulatory Visit: Payer: Self-pay | Admitting: Family Medicine

## 2015-12-11 DIAGNOSIS — M545 Low back pain: Principal | ICD-10-CM

## 2015-12-11 DIAGNOSIS — G8929 Other chronic pain: Secondary | ICD-10-CM

## 2015-12-11 MED ORDER — ALPRAZOLAM 1 MG PO TABS: 1.0000 mg | ORAL_TABLET | Freq: Three times a day (TID) | ORAL | 0 refills | Status: DC | PRN

## 2015-12-11 NOTE — Telephone Encounter (Signed)
Medication called/sent to requested pharmacy  

## 2015-12-11 NOTE — Telephone Encounter (Signed)
Ok to refill 

## 2015-12-12 NOTE — Telephone Encounter (Signed)
Prescription sent to pharmacy.

## 2015-12-12 NOTE — Telephone Encounter (Signed)
ok 

## 2016-01-08 ENCOUNTER — Telehealth: Payer: Self-pay | Admitting: Family Medicine

## 2016-01-08 NOTE — Telephone Encounter (Signed)
Requesting refill on Xanax - Ok to refill??       

## 2016-01-09 NOTE — Telephone Encounter (Signed)
ok 

## 2016-01-10 MED ORDER — ALPRAZOLAM 1 MG PO TABS: 1.0000 mg | ORAL_TABLET | Freq: Three times a day (TID) | ORAL | 2 refills | Status: DC | PRN

## 2016-01-10 NOTE — Telephone Encounter (Signed)
Medication called/sent to requested pharmacy  

## 2016-01-27 ENCOUNTER — Other Ambulatory Visit: Payer: Self-pay | Admitting: Family Medicine

## 2016-01-27 DIAGNOSIS — G8929 Other chronic pain: Secondary | ICD-10-CM

## 2016-01-27 DIAGNOSIS — M545 Low back pain: Principal | ICD-10-CM

## 2016-01-28 NOTE — Telephone Encounter (Signed)
ok 

## 2016-01-28 NOTE — Telephone Encounter (Signed)
Ok to refill Flexeril? 

## 2016-01-30 NOTE — Telephone Encounter (Signed)
ok 

## 2016-02-03 ENCOUNTER — Other Ambulatory Visit: Payer: Self-pay | Admitting: Cardiovascular Disease

## 2016-02-03 NOTE — Telephone Encounter (Signed)
REFILL 

## 2016-03-04 ENCOUNTER — Other Ambulatory Visit: Payer: Self-pay | Admitting: Cardiovascular Disease

## 2016-03-04 NOTE — Telephone Encounter (Signed)
Rx has been sent to the pharmacy electronically. ° °

## 2016-03-06 ENCOUNTER — Telehealth: Payer: Self-pay | Admitting: Family Medicine

## 2016-03-06 MED ORDER — ALPRAZOLAM 1 MG PO TABS: 1.0000 mg | ORAL_TABLET | Freq: Three times a day (TID) | ORAL | 2 refills | Status: DC | PRN

## 2016-03-06 NOTE — Telephone Encounter (Signed)
Requesting refill on Xanax - L[facility] 02/07/16 LOV 07/15/15 Ok to refill??

## 2016-03-06 NOTE — Telephone Encounter (Signed)
okay

## 2016-03-06 NOTE — Telephone Encounter (Signed)
Medication called/sent to requested pharmacy  

## 2016-03-24 ENCOUNTER — Other Ambulatory Visit: Payer: Self-pay | Admitting: Cardiovascular Disease

## 2016-03-24 ENCOUNTER — Other Ambulatory Visit: Payer: Self-pay | Admitting: Family Medicine

## 2016-03-24 NOTE — Telephone Encounter (Signed)
Rx has been sent to the pharmacy electronically. ° °

## 2016-03-24 NOTE — Telephone Encounter (Signed)
ok 

## 2016-03-24 NOTE — Telephone Encounter (Signed)
Ok to refill 

## 2016-04-27 DIAGNOSIS — M9901 Segmental and somatic dysfunction of cervical region: Secondary | ICD-10-CM | POA: Diagnosis not present

## 2016-04-27 DIAGNOSIS — M5416 Radiculopathy, lumbar region: Secondary | ICD-10-CM | POA: Diagnosis not present

## 2016-04-27 DIAGNOSIS — M9903 Segmental and somatic dysfunction of lumbar region: Secondary | ICD-10-CM | POA: Diagnosis not present

## 2016-04-27 DIAGNOSIS — M5033 Other cervical disc degeneration, cervicothoracic region: Secondary | ICD-10-CM | POA: Diagnosis not present

## 2016-05-04 DIAGNOSIS — G894 Chronic pain syndrome: Secondary | ICD-10-CM | POA: Diagnosis not present

## 2016-05-04 DIAGNOSIS — M545 Low back pain: Secondary | ICD-10-CM | POA: Diagnosis not present

## 2016-05-04 DIAGNOSIS — Z79891 Long term (current) use of opiate analgesic: Secondary | ICD-10-CM | POA: Diagnosis not present

## 2016-05-04 DIAGNOSIS — M546 Pain in thoracic spine: Secondary | ICD-10-CM | POA: Diagnosis not present

## 2016-05-04 DIAGNOSIS — M542 Cervicalgia: Secondary | ICD-10-CM | POA: Diagnosis not present

## 2016-05-11 ENCOUNTER — Other Ambulatory Visit: Payer: Self-pay | Admitting: Family Medicine

## 2016-05-11 NOTE — Telephone Encounter (Signed)
Ok to refill 

## 2016-05-11 NOTE — Telephone Encounter (Signed)
ok 

## 2016-05-12 ENCOUNTER — Other Ambulatory Visit: Payer: Self-pay | Admitting: Cardiovascular Disease

## 2016-05-18 DIAGNOSIS — M9901 Segmental and somatic dysfunction of cervical region: Secondary | ICD-10-CM | POA: Diagnosis not present

## 2016-05-18 DIAGNOSIS — M5033 Other cervical disc degeneration, cervicothoracic region: Secondary | ICD-10-CM | POA: Diagnosis not present

## 2016-05-18 DIAGNOSIS — M5416 Radiculopathy, lumbar region: Secondary | ICD-10-CM | POA: Diagnosis not present

## 2016-05-18 DIAGNOSIS — M9903 Segmental and somatic dysfunction of lumbar region: Secondary | ICD-10-CM | POA: Diagnosis not present

## 2016-06-01 DIAGNOSIS — M545 Low back pain: Secondary | ICD-10-CM | POA: Diagnosis not present

## 2016-06-01 DIAGNOSIS — M542 Cervicalgia: Secondary | ICD-10-CM | POA: Diagnosis not present

## 2016-06-01 DIAGNOSIS — Z79891 Long term (current) use of opiate analgesic: Secondary | ICD-10-CM | POA: Diagnosis not present

## 2016-06-01 DIAGNOSIS — G894 Chronic pain syndrome: Secondary | ICD-10-CM | POA: Diagnosis not present

## 2016-06-01 DIAGNOSIS — M546 Pain in thoracic spine: Secondary | ICD-10-CM | POA: Diagnosis not present

## 2016-06-05 ENCOUNTER — Other Ambulatory Visit: Payer: Self-pay | Admitting: Cardiovascular Disease

## 2016-06-12 ENCOUNTER — Encounter: Payer: Self-pay | Admitting: Family Medicine

## 2016-06-12 ENCOUNTER — Ambulatory Visit (INDEPENDENT_AMBULATORY_CARE_PROVIDER_SITE_OTHER): Payer: Medicare Other | Admitting: Family Medicine

## 2016-06-12 VITALS — BP 132/90 | HR 84 | Temp 98.5°F | Resp 16 | Ht 69.0 in | Wt 186.0 lb

## 2016-06-12 DIAGNOSIS — Z23 Encounter for immunization: Secondary | ICD-10-CM

## 2016-06-12 DIAGNOSIS — Z0000 Encounter for general adult medical examination without abnormal findings: Secondary | ICD-10-CM | POA: Diagnosis not present

## 2016-06-12 DIAGNOSIS — E7800 Pure hypercholesterolemia, unspecified: Secondary | ICD-10-CM

## 2016-06-12 DIAGNOSIS — F411 Generalized anxiety disorder: Secondary | ICD-10-CM | POA: Diagnosis not present

## 2016-06-12 DIAGNOSIS — E781 Pure hyperglyceridemia: Secondary | ICD-10-CM

## 2016-06-12 LAB — COMPLETE METABOLIC PANEL WITH GFR
ALT: 21 U/L (ref 9–46)
AST: 22 U/L (ref 10–35)
Albumin: 4.7 g/dL (ref 3.6–5.1)
Alkaline Phosphatase: 54 U/L (ref 40–115)
BUN: 11 mg/dL (ref 7–25)
CHLORIDE: 104 mmol/L (ref 98–110)
CO2: 24 mmol/L (ref 20–31)
Calcium: 9.7 mg/dL (ref 8.6–10.3)
Creat: 0.98 mg/dL (ref 0.70–1.33)
GFR, EST NON AFRICAN AMERICAN: 88 mL/min (ref >=60)
Glucose, Bld: 99 mg/dL (ref 70–99)
Potassium: 4.3 mmol/L (ref 3.5–5.3)
Sodium: 138 mmol/L (ref 135–146)
Total Bilirubin: 0.9 mg/dL (ref 0.2–1.2)
Total Protein: 7.3 g/dL (ref 6.1–8.1)

## 2016-06-12 LAB — CBC WITH DIFFERENTIAL/PLATELET
BASOS ABS: 0 {cells}/uL (ref 0–200)
BASOS PCT: 0 %
EOS ABS: 58 {cells}/uL (ref 15–500)
Eosinophils Relative: 1 %
HCT: 47.1 % (ref 38.5–50.0)
Hemoglobin: 16.2 g/dL (ref 13.0–17.0)
LYMPHS PCT: 30 %
Lymphs Abs: 1740 cells/uL (ref 850–3900)
MCH: 31.6 pg (ref 27.0–33.0)
MCHC: 34.4 g/dL (ref 32.0–36.0)
MCV: 92 fL (ref 80.0–100.0)
MONOS PCT: 10 %
MPV: 9.7 fL (ref 7.5–12.5)
Monocytes Absolute: 580 cells/uL (ref 200–950)
Neutro Abs: 3422 cells/uL (ref 1500–7800)
Neutrophils Relative %: 59 %
PLATELETS: 186 10*3/uL (ref 140–400)
RBC: 5.12 MIL/uL (ref 4.20–5.80)
RDW: 13.3 % (ref 11.0–15.0)
WBC: 5.8 10*3/uL (ref 3.8–10.8)

## 2016-06-12 LAB — LIPID PANEL
Cholesterol: 144 mg/dL (ref <200)
HDL: 59 mg/dL (ref >40)
LDL CALC: 74 mg/dL (ref <100)
TRIGLYCERIDES: 54 mg/dL (ref <150)
Total CHOL/HDL Ratio: 2.4 Ratio (ref <5.0)
VLDL: 11 mg/dL (ref <30)

## 2016-06-12 MED ORDER — DOXYCYCLINE HYCLATE 100 MG PO TABS: 100.0000 mg | ORAL_TABLET | Freq: Two times a day (BID) | ORAL | 0 refills | Status: DC

## 2016-06-12 MED ORDER — ALPRAZOLAM 1 MG PO TABS: ORAL_TABLET | ORAL | 0 refills | Status: DC

## 2016-06-12 NOTE — Progress Notes (Signed)
Subjective:    Patient ID: George Mclaughlin, male    DOB: May 16, 1960, 56 y.o.   MRN: [id number]  HPI Patient is a 56 year old white male here for CPE. Past medical history is significant for chronic low back pain. Patient has had 2 separate back surgeries. The first was in 1999 and the second was in 2000. He underwent laminectomy and discectomy at L4, L5, and S1. At the time he was experiencing symptoms of cauda equina syndrome with weakness and semi-paralysis in his legs. The surgery [facility]. Since that time the strength has returned to his legs. He denies any numbness and tingling but he does have chronic low back pain. He takes oxycodone 1-2 tablets a day as needed. He also reports spells that he gets once a month where he will develop muscle spasms in his lower back. This tends to respond better to Flexeril and meloxicam.   He is currently at [facility].  His provider there has given him an ultimatum of a there is either stopping Xanax or stopping his narcotics. Patient has been on Xanax 3 times a day for anxiety for more than 36 years. Therefore he needs to wean down gradually to avoid withdrawal. He states that he cannot function without his pain medication and therefore, when given the choice, he decides try to wean off the Xanax.  His last colonoscopy was 3 years ago and was normal. He is due for PSA in 07/2016. He refuses a flu shot but will consent to a tetanus vaccine  Past Medical History:  Diagnosis Date  . Anxiety   . Hyperlipidemia   . Smoker    Past Surgical History:  Procedure Laterality Date  . BACK SURGERY  1999 & 2000   laminectomy and discectomy L4, L5, S1 ([facility]  . NM MYOCAR PERF WALL MOTION  05/26/2010   Normal  . SPINE SURGERY    . US ECHOCARDIOGRAPHY  05/26/2010   mild MR   Current Outpatient Prescriptions on File Prior to Visit  Medication Sig Dispense Refill  . cyclobenzaprine (FLEXERIL) 10 MG tablet TAKE 1  TABLET BY MOUTH 3 TIMES A DAY AS NEEDED 30 tablet 0  . esomeprazole (NEXIUM) 40 MG capsule TAKE 1 CAPSULE BY MOUTH ONCE DAILY 30 capsule 1  . fenofibrate (TRICOR) 145 MG tablet TAKE 1 TABLET BY MOUTH ONCE A DAY. NEED OFFICE VISIT FOR ADDITIONAL REFILLS. 30 tablet 0  . meloxicam (MOBIC) 7.5 MG tablet TAKE 1 TABLET BY MOUTH DAILY AS NEEDED 30 tablet 5  . Oxycodone HCl 10 MG TABS Take 10 mg by mouth. Prn    . rosuvastatin (CRESTOR) 40 MG tablet TAKE 1 TABLET BY MOUTH ONCE A DAY. NEED OFFICE VISIT FOR ADDITIONAL REFILLS. 30 tablet 0  . sildenafil (VIAGRA) 50 MG tablet Take 1/2 to 1 tablet as needed. 10 tablet 0  . VASCEPA 1 g CAPS TAKE 2 CAPSULES BY MOUTH TWICE DAILY *NEED OFFICE VISIT* 60 capsule 0   No current facility-administered medications on file prior to visit.    No Known Allergies Social History   Social History  . Marital status: Divorced    Spouse name: N/A  . Number of children: N/A  . Years of education: N/A   Occupational History  . Not on file.   Social History Main Topics  . Smoking status: Current Some Day Smoker    Types: Cigarettes  . Smokeless tobacco: Never Used  Comment: smoke about 3 cigarettes daily  . Alcohol use 3.6 - 7.2 oz/week    6 - 12 Standard drinks or equivalent per week  . Drug use: No     Comment: used "pot" 25 years ago.  Marland Kitchen Sexual activity: Not on file   Other Topics Concern  . Not on file   Social History Narrative  . No narrative on file   Family History  Problem Relation Age of Onset  . Heart disease Mother   . Kidney Stones Mother   . Breast cancer Mother   . Heart disease Father   . Heart disease Maternal Grandmother       Review of Systems  All other systems reviewed and are negative.      Objective:   Physical Exam  Constitutional: He is oriented to person, place, and time. He appears well-developed and well-nourished. No distress.  HENT:  Head: Normocephalic and atraumatic.  Right Ear: External ear normal.  Left  Ear: External ear normal.  Nose: Nose normal.  Mouth/Throat: Oropharynx is clear and moist. No oropharyngeal exudate.  Eyes: Conjunctivae and EOM are normal. Pupils are equal, round, and reactive to light. Right eye exhibits no discharge. Left eye exhibits no discharge. No scleral icterus.  Neck: Normal range of motion. Neck supple. No JVD present. No tracheal deviation present. No thyromegaly present.  Cardiovascular: Normal rate, regular rhythm, normal heart sounds and intact distal pulses.  Exam reveals no gallop and no friction rub.   No murmur heard. Pulmonary/Chest: Effort normal and breath sounds normal. No stridor. No respiratory distress. He has no wheezes. He has no rales. He exhibits no tenderness.  Abdominal: Soft. Bowel sounds are normal. He exhibits no distension and no mass. There is no tenderness. There is no rebound and no guarding.  Musculoskeletal: Normal range of motion. He exhibits no edema or tenderness.  Lymphadenopathy:    He has no cervical adenopathy.  Neurological: He is alert and oriented to person, place, and time. He has normal reflexes. No cranial nerve deficit. He exhibits normal muscle tone. Coordination normal.  Skin: Skin is warm. No rash noted. He is not diaphoretic. No erythema. No pallor.  Psychiatric: He has a normal mood and affect. His behavior is normal. Judgment and thought content normal.  Vitals reviewed.         Assessment & Plan:  General medical exam - Plan: CBC with Differential/Platelet, COMPLETE METABOLIC PANEL WITH GFR, Lipid panel  Pure hypercholesterolemia - Plan: CBC with Differential/Platelet, COMPLETE METABOLIC PANEL WITH GFR, Lipid panel  Hypertriglyceridemia  Generalized anxiety disorder  Physical exam today is normal. His blood pressures borderline. We will wean the patient down gradually on Xanax. Decrease to 1/2 mg tablets 3 times a day for 2 weeks, then 1/2 mg tablets twice a day for 2 weeks, then 1/2 mg tablet daily for 2  weeks, and last one half milligram tablet every other day for 2 weeks and then stop altogether. I will check a CBC, CMP, fasting lipid panel. Goal LDL cholesterol is less than 100. Continue to encourage smoking cessation. Continue to encourage regular aerobic exercise. Pain medication is deferred to his pain management Center. He can return for PSA in April as it has not quite been 12 months since last PSA was checked. He declines a flu shot but he does receive the tetanus vaccine today

## 2016-06-12 NOTE — Addendum Note (Signed)
Addended by: Legrand RamsWILLIS, SANDY B on: 06/12/2016 09:11 AM   Modules accepted: Orders

## 2016-06-15 ENCOUNTER — Encounter: Payer: Self-pay | Admitting: Family Medicine

## 2016-06-16 DIAGNOSIS — M9901 Segmental and somatic dysfunction of cervical region: Secondary | ICD-10-CM | POA: Diagnosis not present

## 2016-06-16 DIAGNOSIS — M9903 Segmental and somatic dysfunction of lumbar region: Secondary | ICD-10-CM | POA: Diagnosis not present

## 2016-06-16 DIAGNOSIS — M5416 Radiculopathy, lumbar region: Secondary | ICD-10-CM | POA: Diagnosis not present

## 2016-06-16 DIAGNOSIS — M5033 Other cervical disc degeneration, cervicothoracic region: Secondary | ICD-10-CM | POA: Diagnosis not present

## 2016-06-23 ENCOUNTER — Other Ambulatory Visit: Payer: Self-pay | Admitting: Cardiovascular Disease

## 2016-06-24 ENCOUNTER — Other Ambulatory Visit: Payer: Self-pay | Admitting: Family Medicine

## 2016-06-24 NOTE — Telephone Encounter (Signed)
Rx(s) sent to pharmacy electronically.  

## 2016-06-30 DIAGNOSIS — M9903 Segmental and somatic dysfunction of lumbar region: Secondary | ICD-10-CM | POA: Diagnosis not present

## 2016-06-30 DIAGNOSIS — M9901 Segmental and somatic dysfunction of cervical region: Secondary | ICD-10-CM | POA: Diagnosis not present

## 2016-06-30 DIAGNOSIS — M5033 Other cervical disc degeneration, cervicothoracic region: Secondary | ICD-10-CM | POA: Diagnosis not present

## 2016-06-30 DIAGNOSIS — M5416 Radiculopathy, lumbar region: Secondary | ICD-10-CM | POA: Diagnosis not present

## 2016-07-03 DIAGNOSIS — G894 Chronic pain syndrome: Secondary | ICD-10-CM | POA: Diagnosis not present

## 2016-07-03 DIAGNOSIS — Z79891 Long term (current) use of opiate analgesic: Secondary | ICD-10-CM | POA: Diagnosis not present

## 2016-07-03 DIAGNOSIS — M542 Cervicalgia: Secondary | ICD-10-CM | POA: Diagnosis not present

## 2016-07-03 DIAGNOSIS — M545 Low back pain: Secondary | ICD-10-CM | POA: Diagnosis not present

## 2016-07-13 ENCOUNTER — Ambulatory Visit (INDEPENDENT_AMBULATORY_CARE_PROVIDER_SITE_OTHER): Payer: Medicare Other | Admitting: Family Medicine

## 2016-07-13 ENCOUNTER — Encounter: Payer: Self-pay | Admitting: Family Medicine

## 2016-07-13 VITALS — BP 140/88 | HR 94 | Temp 98.1°F | Resp 18 | Ht 69.0 in | Wt 193.0 lb

## 2016-07-13 DIAGNOSIS — B370 Candidal stomatitis: Secondary | ICD-10-CM | POA: Diagnosis not present

## 2016-07-13 NOTE — Progress Notes (Signed)
Subjective:    Patient ID: George Mclaughlin, male    DOB: 11-23-1960, 56 y.o.   MRN: [id number]  HPI He reports a thick white plaque on his tongue, and a foul taste in his mouth. The plaque on his tongue comes and goes as does the foul taste in his mouth. Symptoms began after he discontinued Xanax.  I explained to the patient that discontinuation of medication could cause an alteration in the perception of taste but likely has nothing to do with white plaque on his tongue. Leading differential diagnosis would be thrush. On examination today, the oral cavity is completely normal in appearance. However the patient states that it comes and goes. Past Medical History:  Diagnosis Date  . Anxiety   . Hyperlipidemia   . Smoker    Past Surgical History:  Procedure Laterality Date  . BACK SURGERY  1999 & 2000   laminectomy and discectomy L4, L5, S1 (Dr. Senaida Ores at [facility])  . NM MYOCAR PERF WALL MOTION  05/26/2010   Normal  . SPINE SURGERY    . US ECHOCARDIOGRAPHY  05/26/2010   mild MR   Current Outpatient Prescriptions on File Prior to Visit  Medication Sig Dispense Refill  . ALPRAZolam (XANAX) 1 MG tablet 1/2 a pill three times a day for 2 weeks, the 1/2 a pill twice a day for 2 weeks then 1/2 pill day for 2 weeks then 1/2 pill every other day for 2 weeks then stop. 60 tablet 0  . cyclobenzaprine (FLEXERIL) 10 MG tablet TAKE 1 TABLET BY MOUTH 3 TIMES A DAY AS NEEDED 30 tablet 0  . esomeprazole (NEXIUM) 40 MG capsule TAKE 1 CAPSULE BY MOUTH ONCE DAILY 30 capsule 1  . meloxicam (MOBIC) 7.5 MG tablet TAKE 1 TABLET BY MOUTH DAILY AS NEEDED 30 tablet 5  . Oxycodone HCl 10 MG TABS Take 10 mg by mouth. Prn    . sildenafil (VIAGRA) 50 MG tablet Take 1/2 to 1 tablet as needed. 10 tablet 0  . VASCEPA 1 g CAPS TAKE 2 CAPSULES BY MOUTH TWICE DAILY *NEED OFFICE VISIT* 60 capsule 0  . fenofibrate (TRICOR) 145 MG tablet Take 1 tablet (145 mg total) by mouth daily. PLEASE CONTACT OFFICE FOR ADDITIONAL REFILLS  (Patient not taking: Reported on 07/13/2016) 7 tablet 0  . rosuvastatin (CRESTOR) 40 MG tablet TAKE 1 TABLET BY MOUTH ONCE A DAY. NEED OFFICE VISIT FOR ADDITIONAL REFILLS. (Patient not taking: Reported on 07/13/2016) 30 tablet 0   No current facility-administered medications on file prior to visit.    No Known Allergies Social History   Social History  . Marital status: Divorced    Spouse name: N/A  . Number of children: N/A  . Years of education: N/A   Occupational History  . Not on file.   Social History Main Topics  . Smoking status: Current Some Day Smoker    Types: Cigarettes  . Smokeless tobacco: Never Used     Comment: smoke about 3 cigarettes daily  . Alcohol use 3.6 - 7.2 oz/week    6 - 12 Standard drinks or equivalent per week  . Drug use: No     Comment: used "pot" 25 years ago.  Marland Kitchen Sexual activity: Not on file   Other Topics Concern  . Not on file   Social History Narrative  . No narrative on file      Review of Systems  All other systems reviewed and are negative.  Objective:   Physical Exam  Constitutional: He appears well-developed and well-nourished.  HENT:  Nose: Nose normal.  Mouth/Throat: Oropharynx is clear and moist. No oropharyngeal exudate.  Neck: Neck supple.  Cardiovascular: Normal rate, regular rhythm and normal heart sounds.   Pulmonary/Chest: Effort normal and breath sounds normal.  Lymphadenopathy:    He has no cervical adenopathy.  Vitals reviewed.         Assessment & Plan:  Thrush  Exam is unremarkable but story would be consistent with thrush. We will try nystatin 5 ML's gargle and swallow 4 times a day for 1 week. If patient continues to have metallic taste in his mouth afterwards, I would attribute to medication changes however I would not resume Xanax simply for that reason. Consider something like Lexapro for anxiety if necessary

## 2016-07-14 DIAGNOSIS — M9901 Segmental and somatic dysfunction of cervical region: Secondary | ICD-10-CM | POA: Diagnosis not present

## 2016-07-14 DIAGNOSIS — M9903 Segmental and somatic dysfunction of lumbar region: Secondary | ICD-10-CM | POA: Diagnosis not present

## 2016-07-14 DIAGNOSIS — M5416 Radiculopathy, lumbar region: Secondary | ICD-10-CM | POA: Diagnosis not present

## 2016-07-14 DIAGNOSIS — M5033 Other cervical disc degeneration, cervicothoracic region: Secondary | ICD-10-CM | POA: Diagnosis not present

## 2016-07-16 ENCOUNTER — Other Ambulatory Visit: Payer: Self-pay | Admitting: Family Medicine

## 2016-07-21 ENCOUNTER — Other Ambulatory Visit: Payer: Self-pay | Admitting: Family Medicine

## 2016-07-21 ENCOUNTER — Other Ambulatory Visit: Payer: Self-pay | Admitting: Cardiovascular Disease

## 2016-07-21 NOTE — Telephone Encounter (Signed)
Rx request sent to pharmacy.  

## 2016-07-21 NOTE — Telephone Encounter (Signed)
rx called in

## 2016-07-21 NOTE — Telephone Encounter (Signed)
ok 

## 2016-07-21 NOTE — Telephone Encounter (Signed)
LRF 12/12/15 #30  LOV 07/13/16  Ok refill?

## 2016-07-28 DIAGNOSIS — M5416 Radiculopathy, lumbar region: Secondary | ICD-10-CM | POA: Diagnosis not present

## 2016-07-28 DIAGNOSIS — M5033 Other cervical disc degeneration, cervicothoracic region: Secondary | ICD-10-CM | POA: Diagnosis not present

## 2016-07-28 DIAGNOSIS — M9901 Segmental and somatic dysfunction of cervical region: Secondary | ICD-10-CM | POA: Diagnosis not present

## 2016-07-28 DIAGNOSIS — M9903 Segmental and somatic dysfunction of lumbar region: Secondary | ICD-10-CM | POA: Diagnosis not present

## 2016-07-31 DIAGNOSIS — G894 Chronic pain syndrome: Secondary | ICD-10-CM | POA: Diagnosis not present

## 2016-07-31 DIAGNOSIS — M5412 Radiculopathy, cervical region: Secondary | ICD-10-CM | POA: Diagnosis not present

## 2016-07-31 DIAGNOSIS — M25512 Pain in left shoulder: Secondary | ICD-10-CM | POA: Diagnosis not present

## 2016-07-31 DIAGNOSIS — M546 Pain in thoracic spine: Secondary | ICD-10-CM | POA: Diagnosis not present

## 2016-07-31 DIAGNOSIS — Z79891 Long term (current) use of opiate analgesic: Secondary | ICD-10-CM | POA: Diagnosis not present

## 2016-08-04 ENCOUNTER — Ambulatory Visit (INDEPENDENT_AMBULATORY_CARE_PROVIDER_SITE_OTHER): Payer: Medicare Other | Admitting: Family Medicine

## 2016-08-04 ENCOUNTER — Encounter: Payer: Self-pay | Admitting: Family Medicine

## 2016-08-04 ENCOUNTER — Other Ambulatory Visit: Payer: Self-pay | Admitting: Cardiovascular Disease

## 2016-08-04 VITALS — BP 120/84 | HR 76 | Temp 98.4°F | Resp 16 | Ht 69.0 in | Wt 190.0 lb

## 2016-08-04 DIAGNOSIS — F411 Generalized anxiety disorder: Secondary | ICD-10-CM | POA: Diagnosis not present

## 2016-08-04 MED ORDER — HYDROXYZINE PAMOATE 25 MG PO CAPS: 25.0000 mg | ORAL_CAPSULE | Freq: Four times a day (QID) | ORAL | 0 refills | Status: DC | PRN

## 2016-08-04 MED ORDER — ESCITALOPRAM OXALATE 10 MG PO TABS: 10.0000 mg | ORAL_TABLET | Freq: Every day | ORAL | 5 refills | Status: DC

## 2016-08-04 NOTE — Progress Notes (Signed)
Subjective:    Patient ID: George Mclaughlin, male    DOB: 1960/07/21, 56 y.o.   MRN: [id number]  HPI Patient has been on Xanax for many decades. When I inherited this patient, he was taking Xanax 4 times a day. I recommended weaning the patient down gradually and I decreased the patient to 3 times a day Xanax. Recently his pain clinic stated that they would not refill his pain medication if he were taking Xanax. Therefore the patient wanted to quit the medication cold [place]. I told the patient I was concerned about possible withdrawal and I recommended that we wean him down gradually off the Xanax. A proximally 1 week ago, the patient Xanax taper ended. Recently he is reporting racing thoughts, increasing anxiety, worsening insomnia. Today he has somewhat pressured speech. There are no changes or falls. There is no flight of ideas. However he does appear very hyper and anxious and I believe is secondary to benzodiazepine withdrawal. He denies any tremor or seizure or hallucination. He does report worsening focus. However he does not want to resume the Xanax. He would like to find another alternative to prevent this.  Past Medical History:  Diagnosis Date  . Anxiety   . Hyperlipidemia   . Smoker    Past Surgical History:  Procedure Laterality Date  . BACK SURGERY  1999 & 2000   laminectomy and discectomy L4, L5, S1 (Dr. Senaida Ores at [facility])  . NM MYOCAR PERF WALL MOTION  05/26/2010   Normal  . SPINE SURGERY    . US ECHOCARDIOGRAPHY  05/26/2010   mild MR   Current Outpatient Prescriptions on File Prior to Visit  Medication Sig Dispense Refill  . cyclobenzaprine (FLEXERIL) 10 MG tablet TAKE 1 TABLET BY MOUTH 3 TIMES A DAY AS NEEDED 30 tablet 0  . esomeprazole (NEXIUM) 40 MG capsule TAKE 1 CAPSULE BY MOUTH ONCE DAILY 30 capsule 3  . Oxycodone HCl 10 MG TABS Take 10 mg by mouth. Prn     No current facility-administered medications on file prior to visit.    No Known Allergies Social History     Social History  . Marital status: Divorced    Spouse name: N/A  . Number of children: N/A  . Years of education: N/A   Occupational History  . Not on file.   Social History Main Topics  . Smoking status: Current Some Day Smoker    Types: Cigarettes  . Smokeless tobacco: Never Used     Comment: smoke about 3 cigarettes daily  . Alcohol use 3.6 - 7.2 oz/week    6 - 12 Standard drinks or equivalent per week  . Drug use: No     Comment: used "pot" 25 years ago.  Marland Kitchen Sexual activity: Not on file   Other Topics Concern  . Not on file   Social History Narrative  . No narrative on file    Review of Systems  All other systems reviewed and are negative.      Objective:   Physical Exam  Constitutional: He appears well-developed and well-nourished. No distress.  Cardiovascular: Normal rate, regular rhythm and normal heart sounds.   Pulmonary/Chest: Effort normal and breath sounds normal. No respiratory distress. He has no wheezes. He has no rales.  Skin: He is not diaphoretic.  Psychiatric: Judgment and thought content normal. His mood appears anxious. His affect is not angry, not blunt, not labile and not inappropriate. His speech is not rapid and/or pressured. He is hyperactive.  Cognition and memory are normal. Cognition and memory are not impaired. He does not express impulsivity or inappropriate judgment. He does not exhibit a depressed mood. He exhibits normal recent memory and normal remote memory.  Vitals reviewed.         Assessment & Plan:  GAD (generalized anxiety disorder) - Plan: escitalopram (LEXAPRO) 10 MG tablet, hydrOXYzine (VISTARIL) 25 MG capsule  I believe the patient is suffering from benzodiazepine withdrawal and worsening anxiety disorder. Bipolar disorder and ADHD are also on the differential diagnosis. However in the past, the patient has been well controlled on Xanax and therefore I believe that ADHD and bipolar are unlikely. I recommended starting the  patient on Lexapro 10 mg a day to help manage chronic generalized anxiety disorder and using hydroxyzine 25 mg every 6 hours as needed for anxiety until the medication takes effect. Recheck in 4 weeks or sooner if worse

## 2016-08-11 DIAGNOSIS — M5033 Other cervical disc degeneration, cervicothoracic region: Secondary | ICD-10-CM | POA: Diagnosis not present

## 2016-08-11 DIAGNOSIS — M9903 Segmental and somatic dysfunction of lumbar region: Secondary | ICD-10-CM | POA: Diagnosis not present

## 2016-08-11 DIAGNOSIS — M5416 Radiculopathy, lumbar region: Secondary | ICD-10-CM | POA: Diagnosis not present

## 2016-08-11 DIAGNOSIS — M9901 Segmental and somatic dysfunction of cervical region: Secondary | ICD-10-CM | POA: Diagnosis not present

## 2016-08-26 ENCOUNTER — Other Ambulatory Visit: Payer: Self-pay | Admitting: Cardiovascular Disease

## 2016-08-26 ENCOUNTER — Other Ambulatory Visit: Payer: Self-pay | Admitting: Family Medicine

## 2016-08-26 NOTE — Telephone Encounter (Signed)
REFILL 

## 2016-08-26 NOTE — Telephone Encounter (Signed)
Ok to refill 

## 2016-08-27 ENCOUNTER — Encounter: Payer: Self-pay | Admitting: Family Medicine

## 2016-08-27 ENCOUNTER — Ambulatory Visit (INDEPENDENT_AMBULATORY_CARE_PROVIDER_SITE_OTHER): Payer: Medicare Other | Admitting: Family Medicine

## 2016-08-27 VITALS — BP 136/90 | HR 74 | Temp 97.6°F | Resp 16 | Ht 69.0 in | Wt 186.0 lb

## 2016-08-27 DIAGNOSIS — R300 Dysuria: Secondary | ICD-10-CM

## 2016-08-27 DIAGNOSIS — F902 Attention-deficit hyperactivity disorder, combined type: Secondary | ICD-10-CM

## 2016-08-27 LAB — URINALYSIS, ROUTINE W REFLEX MICROSCOPIC
Bilirubin Urine: NEGATIVE
Glucose, UA: NEGATIVE
HGB URINE DIPSTICK: NEGATIVE
KETONES UR: NEGATIVE
LEUKOCYTES UA: NEGATIVE
NITRITE: NEGATIVE
Protein, ur: NEGATIVE
SPECIFIC GRAVITY, URINE: 1.025 (ref 1.001–1.035)
pH: 6 (ref 5.0–8.0)

## 2016-08-27 MED ORDER — AMPHETAMINE-DEXTROAMPHET ER 20 MG PO CP24: 20.0000 mg | ORAL_CAPSULE | ORAL | 0 refills | Status: DC

## 2016-08-27 NOTE — Progress Notes (Signed)
Subjective:    Patient ID: George Mclaughlin, male    DOB: [birth year], 56 y.o.   MRN: [id number]  HPI  08/04/16 Patient has been on Xanax for many decades. When I inherited this patient, he was taking Xanax 4 times a day. I recommended weaning the patient down gradually and I decreased the patient to 3 times a day Xanax. Recently his pain clinic stated that they would not refill his pain medication if he were taking Xanax. Therefore the patient wanted to quit the medication cold [place]. I told the patient I was concerned about possible withdrawal and I recommended that we wean him down gradually off the Xanax. Approximatly 1 week ago, the patient Xanax taper ended. Recently he is reporting racing thoughts, increasing anxiety, worsening insomnia. Today he has somewhat pressured speech. There are no changes or falls. There is no flight of ideas. However he does appear very hyper and anxious and I believe is secondary to benzodiazepine withdrawal. He denies any tremor or seizure or hallucination. He does report worsening focus. However he does not want to resume the Xanax. He would like to find another alternative to prevent this.  At that time, my plan was: I believe the patient is suffering from benzodiazepine withdrawal and worsening anxiety disorder. Bipolar disorder and ADHD are also on the differential diagnosis. However in the past, the patient has been well controlled on Xanax and therefore I believe that ADHD and bipolar are unlikely. I recommended starting the patient on Lexapro 10 mg a day to help manage chronic generalized anxiety disorder and using hydroxyzine 25 mg every 6 hours as needed for anxiety until the medication takes effect. Recheck in 4 weeks or sooner if worse.  08/27/16 Patient saw no benefit from hydroxyzine. He quits that his biggest problem is his ability to focus. He states that his mind races. 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 He provided the example of working on a  tractor. He stopped working on the tractor to go get another tool. As he went to get the other 33-year-old by another project and began starting on that project because it caught his attention. However he is unable to finish that project because the man called him to pick up his tractor. He states that this type of pain occurs constantly throughout the day and prevents him from getting his work done. He questions that if this could've been the problem rather than anxiety his entire life. He reports poor focus, poor concentration, frequently forgetting task, poor organization. He is also very hyper today. There is no tangential speech. His pulse are coherent. However he talks for a quick and is also very animated. He denies any hallucinations or delusions. He denies any depression or anhedonia. He denies any suicidal thoughts. He denies any anxiety. He does have a family history of ADD. Both his sons have been treated for it. He states that growing up, in school, he did very poorly because he was unable to focus in class.  He would frequently forget homework. He would lose assignments constantly. He also would frequently get in trouble for interrupting class in talking.  He also states that he's had some mild dysuria recently. Urinalysis today is unremarkable. He would like to get his annual PSA. He denies any frequency or urgency. He denies any weak stream or hesitancy. He does sometimes have hesitation when he has to void particularly if he's nervous or in a crowd.  Past Medical History:  Diagnosis  Date  . Anxiety   . Hyperlipidemia   . Smoker    Past Surgical History:  Procedure Laterality Date  . BACK SURGERY  1999 & 2000   laminectomy and discectomy L4, L5, S1 ([facility]  . NM MYOCAR PERF WALL MOTION  05/26/2010   Normal  . SPINE SURGERY    . US ECHOCARDIOGRAPHY  05/26/2010   mild MR   Current Outpatient Prescriptions on File Prior to Visit  Medication Sig Dispense Refill  .  cyclobenzaprine (FLEXERIL) 10 MG tablet TAKE 1 TABLET BY MOUTH 3 TIMES A DAY AS NEEDED 30 tablet 1  . escitalopram (LEXAPRO) 10 MG tablet Take 1 tablet (10 mg total) by mouth daily. 30 tablet 5  . esomeprazole (NEXIUM) 40 MG capsule TAKE 1 CAPSULE BY MOUTH ONCE DAILY 30 capsule 3  . hydrOXYzine (VISTARIL) 25 MG capsule Take 1 capsule (25 mg total) by mouth every 6 (six) hours as needed. 60 capsule 0  . Oxycodone HCl 10 MG TABS Take 10 mg by mouth. Prn    . VASCEPA 1 g CAPS TAKE 2 CAPSULES BY MOUTH TWICE A DAY 120 capsule 0   No current facility-administered medications on file prior to visit.    No Known Allergies Social History   Social History  . Marital status: Divorced    Spouse name: N/A  . Number of children: N/A  . Years of education: N/A   Occupational History  . Not on file.   Social History Main Topics  . Smoking status: Current Some Day Smoker    Types: Cigarettes  . Smokeless tobacco: Never Used     Comment: smoke about 3 cigarettes daily  . Alcohol use 3.6 - 7.2 oz/week    6 - 12 Standard drinks or equivalent per week  . Drug use: No     Comment: used "pot" 25 years ago.  Marland Kitchen. Sexual activity: Not on file   Other Topics Concern  . Not on file   Social History Narrative  . No narrative on file    Review of Systems  All other systems reviewed and are negative.      Objective:   Physical Exam  Constitutional: He appears well-developed and well-nourished. No distress.  Cardiovascular: Normal rate, regular rhythm and normal heart sounds.   Pulmonary/Chest: Effort normal and breath sounds normal. No respiratory distress. He has no wheezes. He has no rales.  Skin: He is not diaphoretic.  Psychiatric: Judgment and thought content normal. His mood appears anxious. His affect is not angry, not blunt, not labile and not inappropriate. His speech is not rapid and/or pressured. He is hyperactive. Cognition and memory are normal. Cognition and memory are not impaired. He  does not express impulsivity or inappropriate judgment. He does not exhibit a depressed mood. He exhibits normal recent memory and normal remote memory.  Vitals reviewed.         Assessment & Plan:  Dysuria - Plan: Urinalysis, Routine w reflex microscopic, PSA  Attention deficit hyperactivity disorder (ADHD), combined type Urinalysis is normal. The more I talk to the patient and the better I get to know him, I do believe is possible he has ADHD. I still believe bipolar is unlikely. I want to keep him away from Xanax if at all possible. Therefore I'll try the patient on Adderall XR 20 mg by mouth every morning and recheck in one month to see how he is doing.

## 2016-08-27 NOTE — Telephone Encounter (Signed)
ok 

## 2016-08-28 DIAGNOSIS — M545 Low back pain: Secondary | ICD-10-CM | POA: Diagnosis not present

## 2016-08-28 DIAGNOSIS — G894 Chronic pain syndrome: Secondary | ICD-10-CM | POA: Diagnosis not present

## 2016-08-28 DIAGNOSIS — M546 Pain in thoracic spine: Secondary | ICD-10-CM | POA: Diagnosis not present

## 2016-08-28 DIAGNOSIS — Z79891 Long term (current) use of opiate analgesic: Secondary | ICD-10-CM | POA: Diagnosis not present

## 2016-08-28 DIAGNOSIS — M542 Cervicalgia: Secondary | ICD-10-CM | POA: Diagnosis not present

## 2016-08-28 LAB — PSA: PSA: 0.9 ng/mL (ref <=4.0)

## 2016-09-01 DIAGNOSIS — M9901 Segmental and somatic dysfunction of cervical region: Secondary | ICD-10-CM | POA: Diagnosis not present

## 2016-09-01 DIAGNOSIS — M5033 Other cervical disc degeneration, cervicothoracic region: Secondary | ICD-10-CM | POA: Diagnosis not present

## 2016-09-01 DIAGNOSIS — M9903 Segmental and somatic dysfunction of lumbar region: Secondary | ICD-10-CM | POA: Diagnosis not present

## 2016-09-01 DIAGNOSIS — M5416 Radiculopathy, lumbar region: Secondary | ICD-10-CM | POA: Diagnosis not present

## 2016-09-15 ENCOUNTER — Ambulatory Visit (INDEPENDENT_AMBULATORY_CARE_PROVIDER_SITE_OTHER): Payer: Medicare Other | Admitting: Cardiovascular Disease

## 2016-09-15 ENCOUNTER — Encounter: Payer: Self-pay | Admitting: Cardiovascular Disease

## 2016-09-15 VITALS — BP 158/100 | HR 58 | Ht 69.0 in | Wt 187.0 lb

## 2016-09-15 DIAGNOSIS — Z720 Tobacco use: Secondary | ICD-10-CM

## 2016-09-15 DIAGNOSIS — E782 Mixed hyperlipidemia: Secondary | ICD-10-CM | POA: Diagnosis not present

## 2016-09-15 DIAGNOSIS — Z79899 Other long term (current) drug therapy: Secondary | ICD-10-CM

## 2016-09-15 DIAGNOSIS — I10 Essential (primary) hypertension: Secondary | ICD-10-CM | POA: Diagnosis not present

## 2016-09-15 MED ORDER — VALSARTAN 80 MG PO TABS: 80.0000 mg | ORAL_TABLET | Freq: Every day | ORAL | 6 refills | Status: DC

## 2016-09-15 NOTE — Patient Instructions (Signed)
Your physician recommends that you return for lab work in: 2 weeks fasting.  Your physician has recommended you make the following change in your medication:   1.) start new valsartan prescription sent to your pharmacy.   Your physician recommends that you schedule a follow-up appointment in: 2 months.

## 2016-09-15 NOTE — Progress Notes (Signed)
Patient ID: George Mclaughlin, male   DOB: [birth year], 56 y.o.   MRN: [id number]     HPI: George Mclaughlin is a 56 y.o. male who presents to the office today for a 85 month cardiology evaluation.  George Mclaughlin has a strong family history for CAD. He as a history of hyperlipidemia, tobacco use, as well as EtOH use drinking predominantly beer. In 2012 his cholesterol was 236, triglycerides 340, VLDL 68 and his LDL was 133 suggestive of an atherogenic dyslipidemia pattern. An echo Doppler study showed normal size and function with mild mitral and tricuspid regurgitation. A nuclear perfusion study for atypical chest pain was normal. He was initially started on Simcor 500/20.   He sees Dr. Hardin Negus for primary care and lab work by Dr. Hardin Negus  had shown a triglyceride level of 435.  When I saw him 2 years ago I added fenofibrate 145 mg  and also started him on Vascepa 2 capsules twice a day in light of his marked hypertriglyceridemia. I also recommended he take Crestor 10 mg daily.  Laboratory from 01/05/2013 was significantly improved and revealed a hemoglobin of 15.8 hematocrit 44.9; cholesterol 135, triglycerides 107. LDL calculated was 75 and LDL particle number by NMR was 1098. Insulin resistance score was elevated at 59.Small  LDL particle size improved to 416.  In 2015, his LDL particles had increased to 1498 from 1098. LDL calculated cholesterol had increased from 75-103. His LDL particle member had increased to 792. Fortunately his triglycerides still remain fairly normal at 133. Total cholesterol was 178. Insulin resistance score was 52.   Since I last saw him in June 2016 he continues to smoke 4 cigarettes a day.  He remains relatively active but does not routinely exercise.  He does have difficulty with upper back discomfort.  He notes vague episodes of a chest sensation which is not exertional precipitation and feels it may be related to his back.  He also has had difficulty with his shoulder.   Recently, he is felt that his blood pressure may be increasing, but he had not been started on any medication by his primary physician.  Remotely, he had been on Crestor and fenofibrate but apparently has stopped these.  He continues to be Vascepa 2 capsules twice a day.  He takes Nexium on an as-needed basis for GERD.  He presents for evaluation.  Past Medical History:  Diagnosis Date  . Anxiety   . Hyperlipidemia   . Smoker     Past Surgical History:  Procedure Laterality Date  . BACK SURGERY  1999 & 2000   laminectomy and discectomy L4, L5, S1 (Dr. Marvel Plan at [facility])  . NM MYOCAR PERF WALL MOTION  05/26/2010   Normal  . SPINE SURGERY    . US ECHOCARDIOGRAPHY  05/26/2010   mild MR    No Known Allergies  Current Outpatient Prescriptions  Medication Sig Dispense Refill  . cyclobenzaprine (FLEXERIL) 10 MG tablet TAKE 1 TABLET BY MOUTH 3 TIMES A DAY AS NEEDED 30 tablet 1  . esomeprazole (NEXIUM) 40 MG capsule Take 40 mg by mouth as needed.    . hydrOXYzine (VISTARIL) 25 MG capsule Take 1 capsule (25 mg total) by mouth every 6 (six) hours as needed. 60 capsule 0  . Oxycodone HCl 10 MG TABS Take 10 mg by mouth. Prn    . VASCEPA 1 g CAPS TAKE 2 CAPSULES BY MOUTH TWICE A DAY 120 capsule 0  . valsartan (DIOVAN) 80 MG tablet  Take 1 tablet (80 mg total) by mouth daily. 30 tablet 6   No current facility-administered medications for this visit.     Socially He is divorced. Previously, he used to drink several sixpacks of beer a week. He states he currently smokes at most 2 cigarettes on the days that he smokes but other days he does not smoke. He denies change in hearing. He denies change in weight. His other lymphadenopathy. He has 2 children. He is divorced. He takes care of his mother George Mclaughlin who is one of my patients.  ROS General: Negative; No fevers, chills, or night sweats;  HEENT: Negative; No changes in vision or hearing, sinus congestion, difficulty swallowing Pulmonary:  Negative; No cough, wheezing, shortness of breath, hemoptysis Cardiovascular: Negative; No chest pain, presyncope, syncope, palpitations GI: Negative; No nausea, vomiting, diarrhea, or abdominal pain GU: Mild erectile dysfunction; No dysuria, hematuria, or difficulty voiding Musculoskeletal: Occasional back discomfort; no myalgias, joint pain, or weakness Hematologic/Oncology: Negative; no easy bruising, bleeding Endocrine: Negative; no heat/cold intolerance; no diabetes Neuro: Negative; no changes in balance, headaches Skin: Negative; No rashes or skin lesions Psychiatric: Occasional anxiety Sleep: Negative; No snoring, daytime sleepiness, hypersomnolence, bruxism, restless legs, hypnogognic hallucinations, no cataplexy Other comprehensive 14 point system review is negative.   PE BP (!) 158/100   Pulse (!) 58   Ht 5' 9"  (1.753 m)   Wt 187 lb (84.8 kg)   BMI 27.62 kg/m    Repeat blood pressure by me was 160/94 supine and 158/92, standing.  Wt Readings from Last 3 Encounters:  09/15/16 187 lb (84.8 kg)  08/27/16 186 lb (84.4 kg)  08/04/16 190 lb (86.2 kg)   General: Alert, oriented, no distress.  Skin: normal turgor, no rashes HEENT: Normocephalic, atraumatic. Pupils round and reactive; sclera anicteric;no lid lag.  Nose without nasal septal hypertrophy Mouth/Parynx benign; Mallinpatti scale 3 Neck: No JVD, no carotid bruits with normal carotid upstroke no chest wall tenderness to palpation Chest wall: Nontender to palpation Lungs: clear to ausculatation and percussion; no wheezing or rales Heart: RRR, s1 s2 normal, 1/6 systolic murmur.  No diastolic murmur.  No rubs thrills or heaves.  No gallops. Abdomen: soft, nontender; no hepatosplenomehaly, BS+; abdominal aorta nontender and not dilated by palpation. Back: No CVA tenderness Pulses 2+ Extremities: no clubbing cyanosis or edema, Homan's sign negative  Neurologic: grossly nonfocal Psychological: Affect and mood.   ECG  (independently read by me): Sinus bradycardia with sinus arrhythmia.  Heart rate 58 bpm.  Normal intervals.  No ST segment changes.  June 2016 ECG (independently read by me): Normal sinus rhythm at 64 bpm.  Mild sinus arrhythmia.  Mild RV conduction delay.  Normal intervals  ECG (independently read by me): Normal sinus rhythm at 72.  Incomplete right bundle branch block.  No ectopy.  March 2015  ECG: Normal sinus rhythm at 80 beats per minute. Normal intervals. Nondiagnostic T changes in lead 3.  Prior 01/09/2013 ECG: Normal sinus rhythm at75 beats per minute. Nonspecific T. change.  LABS: BMP Latest Ref Rng & Units 06/12/2016 08/02/2015 02/13/2014  Glucose 70 - 99 mg/dL 99 92 94  BUN 7 - 25 mg/dL 11 13 13   Creatinine 0.70 - 1.33 mg/dL 0.98 0.88 0.95  Sodium 135 - 146 mmol/L 138 140 135  Potassium 3.5 - 5.3 mmol/L 4.3 4.4 4.2  Chloride 98 - 110 mmol/L 104 107 104  CO2 20 - 31 mmol/L 24 25 22   Calcium 8.6 - 10.3 mg/dL 9.7 9.6  9.3   Hepatic Function Latest Ref Rng & Units 06/12/2016 08/02/2015 02/13/2014  Total Protein 6.1 - 8.1 g/dL 7.3 7.0 6.9  Albumin 3.6 - 5.1 g/dL 4.7 4.7 4.4  AST 10 - 35 U/L 22 28 25   ALT 9 - 46 U/L 21 39 29  Alk Phosphatase 40 - 115 U/L 54 62 60  Total Bilirubin 0.2 - 1.2 mg/dL 0.9 0.7 0.7   CBC Latest Ref Rng & Units 06/12/2016 08/02/2015 02/13/2014  WBC 3.8 - 10.8 K/uL 5.8 6.0 6.1  Hemoglobin 13.0 - 17.0 g/dL 16.2 16.3 16.4  Hematocrit 38.5 - 50.0 % 47.1 47.5 45.2  Platelets 140 - 400 K/uL 186 166 186   Lab Results  Component Value Date   MCV 92.0 06/12/2016   MCV 92.6 08/02/2015   MCV 89.2 02/13/2014   Lab Results  Component Value Date   TSH 0.929 02/13/2014  No results found for: HGBA1C   Lipid Panel     Component Value Date/Time   CHOL 144 06/12/2016 0840   CHOL 202 (H) 02/13/2014 0846   TRIG 54 06/12/2016 0840   TRIG 152 (H) 02/13/2014 0846   HDL 59 06/12/2016 0840   HDL 41 02/13/2014 0846   CHOLHDL 2.4 06/12/2016 0840   VLDL 11 06/12/2016  0840   LDLCALC 74 06/12/2016 0840   LDLCALC 131 (H) 02/13/2014 0846    BNP No results found for: PROBNP  NMR Lipid Panel from 2015:      Ref Range 3wk ago  28moago  171yrgo     LDL Particle Number <1000 nmol/L 1647 (H) 1498 (H)CM 1098 (H)CM   Comments:             Low          < 1000             Moderate     1000 - 1299             Borderline-High 1300 - 1599             High       1600 - 2000             Very High       > 2000    LDL (calc) 0 - 99 mg/dL 131 (H) 103 (H)R, CM 75R, CM   Comments:             Optimal        < 100             Above optimal   100 - 129             Borderline    130 - 159             High       160 - 189             Very high       > 189       **Please note reference interval change** LDL-C is inaccurate if patient is non-fasting.    HDL-C >39 mg/dL 41 =40 mg/dL" class="rz_1a" style="cursor: pointer;" onmouseover='jscript: var varStyle="underline"; var bgColor="#[id number]"; this.style.backgroundColor=bgColor; var children=this.getElementsByTagName("div"); for(var child=0;child 48R =40 mg/dL" class="rz_1b" style="cursor: pointer;" onmouseover='jscript: var varStyle="underline"; var bgColor="#[email or web address]"; this.style.backgroundColor=bgColor; var children=this.getElementsByTagName("div"); for(var child=0;child 39 (L)R    Triglycerides 0 - 149 mg/dL 152 (H) 133R 107R   Comments:       **Please note reference interval change**    Cholesterol, Total 100 -  199 mg/dL 202 (H) 178R 135R   Comments:       **Please note reference interval change**    HDL Particle Number >=30.5 umol/L 28.5 (L) =30.5 umol/L" class="rz_1a" style="cursor: pointer;" onmouseover='jscript: var varStyle="underline"; var bgColor="#[email or web address]";  this.style.backgroundColor=bgColor; var children=this.getElementsByTagName("div"); for(var child=0;child 31.8 =30.5 umol/L" class="rz_1b" style="cursor: pointer;" onmouseover='jscript: var varStyle="underline"; var bgColor="#[email or web address]"; this.style.backgroundColor=bgColor; var children=this.getElementsByTagName("div"); for(var child=0;child 29.6 (L)    Large HDL-P >=4.8 umol/L 1.7 (L) =4.8 umol/L" class="rz_1a" style="cursor: pointer;" onmouseover='jscript: var varStyle="underline"; var bgColor="#[email or web address]"; this.style.backgroundColor=bgColor; var children=this.getElementsByTagName("div"); for(var child=0;child 1.3 (L) =4.8 umol/L" class="rz_1b" style="cursor: pointer;" onmouseover='jscript: var varStyle="underline"; var bgColor="#[email or web address]"; this.style.backgroundColor=bgColor; var children=this.getElementsByTagName("div"); for(var child=0;child 2.2 (L)    Large VLDL-P <=2.7 nmol/L 4.8 (H) 1.2 1.6    Small LDL Particle Number <=527 nmol/L 599 (H) 792 (H) 416    LDL Size >=20.8 nm 21.0 20.5 nm" class="rz_1a" style="cursor: pointer;" onmouseover='jscript: var varStyle="underline"; var bgColor="#[email or web address]"; this.style.backgroundColor=bgColor; var children=this.getElementsByTagName("div"); for(var child=0;child 20.9R 20.5 nm" class="rz_1b" style="cursor: pointer;" onmouseover='jscript: var varStyle="underline"; var bgColor="#[email or web address]"; this.style.backgroundColor=bgColor; var children=this.getElementsByTagName("div"); for(var child=0;child 20.6R    HDL Size >=9.2 nm 8.5 (L) =9.2 nm" class="rz_1a" style="cursor: pointer;" onmouseover='jscript: var varStyle="underline"; var bgColor="#[email or web address]"; this.style.backgroundColor=bgColor; var children=this.getElementsByTagName("div"); for(var child=0;child 8.4 (L) =9.2 nm" class="rz_1b" style="cursor: pointer; background-color: rgb(222, 231, 239);" onmouseover='jscript: var varStyle="underline"; var bgColor="#D6DFE7"; this.style.backgroundColor=bgColor; var  children=this.getElementsByTagName("div"); for(var child=0;child 8.5 (L)    VLDL Size <=46.6 nm 44.3 41.7 44.1    LP-IR Score <=45  67 (H) 52 (H)CM 59 (H)CM   Comments: ----------------------------------------------------------       INSULIN RESISTANCE / DIABETES RISK MARKERS      <--Insulin Sensitive   Insulin Resistant-->           Percentile in Reference Population Large VLDL-P   Low   25th   50th   75th   High          <0.9  0.9   2.7   6.9   >6.9 Small LDL-P    Low   25th   50th   75th   High          <117  117   527   839   >839 Large HDL-P    High  75th   50th   25th   Low          >7.3  7.3   4.8   3.1   <3.1 VLDL Size     Small  25th   50th   75th   Large          <42.4  42.4   46.6   52.5   >52.5 LDL Size     Large  75th   50th   25th   Small          >21.2  21.2   20.8   20.4   <20.4 HDL Size     Large  75th   50th   25th   Small          >9.6  9.6   9.2   8.9   <8.9 Insulin Resistance Score LP-IR SCORE    Low   25th   50th   75th   High          <27   27    45    63    >63       RADIOLOGY: No results found.  IMPRESSION:  1. Essential hypertension   2. Mixed hyperlipidemia   3. Medication management   4. Tobacco use     ASSESSMENT AND PLAN:  Mr. Jeananne Rama is a 56 year old gentleman with a very strong family history for CAD in both parents, significant hyperlipidemia, and ongoing tobacco use.  Recently, he has noted some increased trend in blood pressure.  He has stage II hypertension today in the office.  He is not on any in a hypertensive medication.  I am suggesting initiation of valsartan at 80 mg, but I suspect he may ultimately need to be increased to 160 mg.  Since I last saw him, he also has  stopped taking both Crestor and fenofibrate and has only been taking omega-3 fatty acid treatment for his mixed hyperlipidemia.  I am recommending a complete set of fasting laboratory be obtained.  I discussed importance of complete smoking cessation.  He remains active but does not routinely exercise.  I have recommended exercise for at least 5 days per week for minimum of 30 minutes if at all possible.  He will continue to monitor his blood pressure.  Once I obtain the results of laboratory, I will contact him and I suspect additional lipid-lowering therapy will be necessary.  I will see him in 2 months for reevaluation.  Time spent: 25 minute   Troy Sine, MD, [facility]  09/16/2016 7:57 AM

## 2016-09-25 ENCOUNTER — Other Ambulatory Visit: Payer: Self-pay | Admitting: Cardiovascular Disease

## 2016-09-25 NOTE — Telephone Encounter (Signed)
Dr Tresa Endokelly wanted to wait to see patient's labs that was ordered before restarting this medication.

## 2016-09-28 ENCOUNTER — Other Ambulatory Visit: Payer: Self-pay | Admitting: Cardiovascular Disease

## 2016-09-29 ENCOUNTER — Other Ambulatory Visit: Payer: Self-pay | Admitting: Cardiovascular Disease

## 2016-09-29 DIAGNOSIS — Z79891 Long term (current) use of opiate analgesic: Secondary | ICD-10-CM | POA: Diagnosis not present

## 2016-09-29 DIAGNOSIS — M546 Pain in thoracic spine: Secondary | ICD-10-CM | POA: Diagnosis not present

## 2016-09-29 DIAGNOSIS — G894 Chronic pain syndrome: Secondary | ICD-10-CM | POA: Diagnosis not present

## 2016-09-29 DIAGNOSIS — M545 Low back pain: Secondary | ICD-10-CM | POA: Diagnosis not present

## 2016-09-29 DIAGNOSIS — M542 Cervicalgia: Secondary | ICD-10-CM | POA: Diagnosis not present

## 2016-09-29 MED ORDER — ROSUVASTATIN CALCIUM 40 MG PO TABS: 40.0000 mg | ORAL_TABLET | Freq: Every day | ORAL | 3 refills | Status: DC

## 2016-09-29 NOTE — Telephone Encounter (Signed)
Rx(s) sent to pharmacy electronically.  

## 2016-09-29 NOTE — Telephone Encounter (Signed)
New message    *STAT* If patient is at the pharmacy, call can be transferred to refill team.   1. Which medications need to be refilled? (please list name of each medication and dose if known) rosuvastatin (CRESTOR) 40 MG tablet  2. Which pharmacy/location (including street and city if local pharmacy) is medication to be sent to? [facility]  3. Do they need a 30 day or 90 day supply? 90 day supply

## 2016-10-12 DIAGNOSIS — M9903 Segmental and somatic dysfunction of lumbar region: Secondary | ICD-10-CM | POA: Diagnosis not present

## 2016-10-12 DIAGNOSIS — M9901 Segmental and somatic dysfunction of cervical region: Secondary | ICD-10-CM | POA: Diagnosis not present

## 2016-10-12 DIAGNOSIS — M5033 Other cervical disc degeneration, cervicothoracic region: Secondary | ICD-10-CM | POA: Diagnosis not present

## 2016-10-12 DIAGNOSIS — M5416 Radiculopathy, lumbar region: Secondary | ICD-10-CM | POA: Diagnosis not present

## 2016-10-19 ENCOUNTER — Other Ambulatory Visit: Payer: Self-pay | Admitting: Family Medicine

## 2016-10-27 ENCOUNTER — Ambulatory Visit: Payer: Medicare Other | Admitting: Cardiovascular Disease

## 2016-10-27 ENCOUNTER — Encounter: Payer: Self-pay | Admitting: *Deleted

## 2016-10-28 DIAGNOSIS — M546 Pain in thoracic spine: Secondary | ICD-10-CM | POA: Diagnosis not present

## 2016-10-28 DIAGNOSIS — M545 Low back pain: Secondary | ICD-10-CM | POA: Diagnosis not present

## 2016-10-28 DIAGNOSIS — G894 Chronic pain syndrome: Secondary | ICD-10-CM | POA: Diagnosis not present

## 2016-10-28 DIAGNOSIS — M542 Cervicalgia: Secondary | ICD-10-CM | POA: Diagnosis not present

## 2016-10-28 DIAGNOSIS — Z79891 Long term (current) use of opiate analgesic: Secondary | ICD-10-CM | POA: Diagnosis not present

## 2016-11-04 ENCOUNTER — Other Ambulatory Visit: Payer: Self-pay | Admitting: Cardiovascular Disease

## 2016-11-04 NOTE — Telephone Encounter (Signed)
Rx has been sent to the pharmacy electronically. ° °

## 2016-11-05 DIAGNOSIS — M9903 Segmental and somatic dysfunction of lumbar region: Secondary | ICD-10-CM | POA: Diagnosis not present

## 2016-11-05 DIAGNOSIS — M5416 Radiculopathy, lumbar region: Secondary | ICD-10-CM | POA: Diagnosis not present

## 2016-11-05 DIAGNOSIS — M5033 Other cervical disc degeneration, cervicothoracic region: Secondary | ICD-10-CM | POA: Diagnosis not present

## 2016-11-05 DIAGNOSIS — M9901 Segmental and somatic dysfunction of cervical region: Secondary | ICD-10-CM | POA: Diagnosis not present

## 2016-11-13 ENCOUNTER — Encounter: Payer: Self-pay | Admitting: Family Medicine

## 2016-11-13 ENCOUNTER — Ambulatory Visit (INDEPENDENT_AMBULATORY_CARE_PROVIDER_SITE_OTHER): Payer: Medicare Other | Admitting: Family Medicine

## 2016-11-13 VITALS — BP 142/90 | HR 72 | Temp 98.5°F | Resp 18 | Ht 69.0 in | Wt 191.0 lb

## 2016-11-13 DIAGNOSIS — M5412 Radiculopathy, cervical region: Secondary | ICD-10-CM | POA: Diagnosis not present

## 2016-11-13 MED ORDER — PREDNISONE 20 MG PO TABS: ORAL_TABLET | ORAL | 0 refills | Status: DC

## 2016-11-13 NOTE — Progress Notes (Signed)
Subjective:    Patient ID: George MuffMark R Mclaughlin, male    DOB: 06-14-1960, 56 y.o.   MRN: [id number]  HPI Patient has a history of degenerative disc disease in the neck. MRI in 2013 revealed a small right-sided disc bulge and degenerative disc disease but no significant findings. However over the last several months, he has developed worsening neck pain now on the left side. It is up the level of C3 and C4. The pain will radiate up into his occiput. He also has burning stinging pain radiating down into his left shoulder down into his left arm. He denies any weakness in the arm. He denies any numbness in the arm. He denies any loss of grip strength. Muscle strength and sensation are normal. He denies any injury to the neck Past Medical History:  Diagnosis Date  . Anxiety   . Hyperlipidemia   . Smoker    Past Surgical History:  Procedure Laterality Date  . BACK SURGERY  1999 & 2000   laminectomy and discectomy L4, L5, S1 ([facility]  . NM MYOCAR PERF WALL MOTION  05/26/2010   Normal  . SPINE SURGERY    . US ECHOCARDIOGRAPHY  05/26/2010   mild MR   Current Outpatient Prescriptions on File Prior to Visit  Medication Sig Dispense Refill  . cyclobenzaprine (FLEXERIL) 10 MG tablet TAKE 1 TABLET BY MOUTH 3 TIMES A DAY AS NEEDED 30 tablet 1  . esomeprazole (NEXIUM) 40 MG capsule TAKE 1 CAPSULE BY MOUTH ONCE DAILY 30 capsule 11  . hydrOXYzine (VISTARIL) 25 MG capsule Take 1 capsule (25 mg total) by mouth every 6 (six) hours as needed. 60 capsule 0  . Oxycodone HCl 10 MG TABS Take 10 mg by mouth. Prn    . rosuvastatin (CRESTOR) 40 MG tablet Take 1 tablet (40 mg total) by mouth daily. 90 tablet 3  . valsartan (DIOVAN) 80 MG tablet Take 1 tablet (80 mg total) by mouth daily. 30 tablet 6  . VASCEPA 1 g CAPS TAKE 2 CAPSULES BY MOUTH TWICE DAILY 120 capsule 11   No current facility-administered medications on file prior to visit.    No Known Allergies Social History   Social History  .  Marital status: Divorced    Spouse name: N/A  . Number of children: N/A  . Years of education: N/A   Occupational History  . Not on file.   Social History Main Topics  . Smoking status: Current Some Day Smoker    Types: Cigarettes  . Smokeless tobacco: Never Used     Comment: smoke about 3 cigarettes daily  . Alcohol use 3.6 - 7.2 oz/week    6 - 12 Standard drinks or equivalent per week  . Drug use: No     Comment: used "pot" 25 years ago.  Marland Kitchen. Sexual activity: Not on file   Other Topics Concern  . Not on file   Social History Narrative  . No narrative on file      Review of Systems  All other systems reviewed and are negative.      Objective:   Physical Exam  Cardiovascular: Normal rate, regular rhythm and normal heart sounds.   Pulmonary/Chest: Effort normal and breath sounds normal. No respiratory distress. He has no wheezes. He has no rales.  Musculoskeletal:       Cervical back: He exhibits decreased range of motion, tenderness, pain and spasm.       Back:  Neurological: He has normal  reflexes. He displays normal reflexes. He exhibits normal muscle tone. Coordination normal.  Vitals reviewed.         Assessment & Plan:  Cervical radiculopathy - Plan: predniSONE (DELTASONE) 20 MG tablet, DG Cervical Spine Complete  Suspect cervical radiculopathy secondary to degenerative disc disease and possibly herniated disc. Begin prednisone taper pack and obtain updated x-ray of the cervical spine. If pain worsens, and x-ray shows no significant abnormality, proceed with an MRI of the cervical spine

## 2016-11-16 DIAGNOSIS — R682 Dry mouth, unspecified: Secondary | ICD-10-CM | POA: Diagnosis not present

## 2016-11-16 DIAGNOSIS — K140 Glossitis: Secondary | ICD-10-CM | POA: Diagnosis not present

## 2016-11-16 DIAGNOSIS — R439 Unspecified disturbances of smell and taste: Secondary | ICD-10-CM | POA: Diagnosis not present

## 2016-11-19 DIAGNOSIS — M5416 Radiculopathy, lumbar region: Secondary | ICD-10-CM | POA: Diagnosis not present

## 2016-11-19 DIAGNOSIS — M9903 Segmental and somatic dysfunction of lumbar region: Secondary | ICD-10-CM | POA: Diagnosis not present

## 2016-11-19 DIAGNOSIS — M5033 Other cervical disc degeneration, cervicothoracic region: Secondary | ICD-10-CM | POA: Diagnosis not present

## 2016-11-19 DIAGNOSIS — M9901 Segmental and somatic dysfunction of cervical region: Secondary | ICD-10-CM | POA: Diagnosis not present

## 2016-11-27 DIAGNOSIS — M5412 Radiculopathy, cervical region: Secondary | ICD-10-CM | POA: Diagnosis not present

## 2016-11-27 DIAGNOSIS — M25512 Pain in left shoulder: Secondary | ICD-10-CM | POA: Diagnosis not present

## 2016-11-27 DIAGNOSIS — G541 Lumbosacral plexus disorders: Secondary | ICD-10-CM | POA: Diagnosis not present

## 2016-11-27 DIAGNOSIS — G894 Chronic pain syndrome: Secondary | ICD-10-CM | POA: Diagnosis not present

## 2016-11-27 DIAGNOSIS — M5416 Radiculopathy, lumbar region: Secondary | ICD-10-CM | POA: Diagnosis not present

## 2016-12-10 ENCOUNTER — Ambulatory Visit
Admission: RE | Admit: 2016-12-10 | Discharge: 2016-12-10 | Disposition: A | Discharge status: Home / Self Care | Payer: Medicare Other | Source: Ambulatory Visit | Attending: Family Medicine | Admitting: Family Medicine

## 2016-12-10 DIAGNOSIS — M9901 Segmental and somatic dysfunction of cervical region: Secondary | ICD-10-CM | POA: Diagnosis not present

## 2016-12-10 DIAGNOSIS — I7789 Other specified disorders of arteries and arterioles: Secondary | ICD-10-CM | POA: Diagnosis not present

## 2016-12-10 DIAGNOSIS — M5416 Radiculopathy, lumbar region: Secondary | ICD-10-CM | POA: Diagnosis not present

## 2016-12-10 DIAGNOSIS — M5412 Radiculopathy, cervical region: Secondary | ICD-10-CM | POA: Diagnosis present

## 2016-12-10 DIAGNOSIS — M542 Cervicalgia: Secondary | ICD-10-CM | POA: Diagnosis not present

## 2016-12-10 DIAGNOSIS — M5033 Other cervical disc degeneration, cervicothoracic region: Secondary | ICD-10-CM | POA: Diagnosis not present

## 2016-12-10 DIAGNOSIS — M4722 Other spondylosis with radiculopathy, cervical region: Secondary | ICD-10-CM | POA: Insufficient documentation

## 2016-12-10 DIAGNOSIS — M9903 Segmental and somatic dysfunction of lumbar region: Secondary | ICD-10-CM | POA: Diagnosis not present

## 2016-12-11 ENCOUNTER — Other Ambulatory Visit: Payer: Self-pay | Admitting: Family Medicine

## 2016-12-11 DIAGNOSIS — M542 Cervicalgia: Secondary | ICD-10-CM

## 2016-12-16 ENCOUNTER — Ambulatory Visit
Admission: RE | Admit: 2016-12-16 | Discharge: 2016-12-16 | Disposition: A | Discharge status: Home / Self Care | Payer: Medicare Other | Source: Ambulatory Visit | Attending: Family Medicine | Admitting: Family Medicine

## 2016-12-16 DIAGNOSIS — M4802 Spinal stenosis, cervical region: Secondary | ICD-10-CM | POA: Diagnosis not present

## 2016-12-16 DIAGNOSIS — M5030 Other cervical disc degeneration, unspecified cervical region: Secondary | ICD-10-CM | POA: Insufficient documentation

## 2016-12-16 DIAGNOSIS — M542 Cervicalgia: Secondary | ICD-10-CM | POA: Diagnosis not present

## 2016-12-18 ENCOUNTER — Ambulatory Visit (INDEPENDENT_AMBULATORY_CARE_PROVIDER_SITE_OTHER): Payer: Medicare Other | Admitting: Family Medicine

## 2016-12-18 ENCOUNTER — Encounter: Payer: Self-pay | Admitting: Family Medicine

## 2016-12-18 VITALS — BP 140/80 | HR 88 | Temp 98.5°F | Resp 18 | Ht 69.0 in | Wt 191.0 lb

## 2016-12-18 DIAGNOSIS — M5030 Other cervical disc degeneration, unspecified cervical region: Secondary | ICD-10-CM | POA: Diagnosis not present

## 2016-12-18 DIAGNOSIS — M5412 Radiculopathy, cervical region: Secondary | ICD-10-CM | POA: Diagnosis not present

## 2016-12-18 MED ORDER — LOSARTAN POTASSIUM 50 MG PO TABS: 50.0000 mg | ORAL_TABLET | Freq: Every day | ORAL | 3 refills | Status: DC

## 2016-12-18 NOTE — Progress Notes (Signed)
Subjective:    Patient ID: George Mclaughlin, male    DOB: February 15, 1961, 56 y.o.   MRN: [id number]  HPI  11/13/16 Patient has a history of degenerative disc disease in the neck. MRI in 2013 revealed a small right-sided disc bulge and degenerative disc disease but no significant findings. However over the last several months, he has developed worsening neck pain now on the left side. It is up the level of C3 and C4. The pain will radiate up into his occiput. He also has burning stinging pain radiating down into his left shoulder down into his left arm. He denies any weakness in the arm. He denies any numbness in the arm. He denies any loss of grip strength. Muscle strength and sensation are normal. He denies any injury to the neck.  At that time, my plan was: Suspect cervical radiculopathy secondary to degenerative disc disease and possibly herniated disc. Begin prednisone taper pack and obtain updated x-ray of the cervical spine. If pain worsens, and x-ray shows no significant abnormality, proceed with an MRI of the cervical spine  12/18/16 MRI revealed-  1. Mildly progressed degenerative change of the cervical spine. 2. Minimal canal stenosis C6-7. 3. Neural foraminal narrowing C3-4 and C6-7: Moderate to severe on the LEFT at C6-7. The patient also needs to switch off her valsartan which has been discontinued. He continues to have pain in the middle of his neck around the level of C4-C5 but he has pain radiating into his left shoulder. He has painless range of motion however in the left shoulder Past Medical History:  Diagnosis Date  . Anxiety   . Hyperlipidemia   . Smoker    Past Surgical History:  Procedure Laterality Date  . BACK SURGERY  1999 & 2000   laminectomy and discectomy L4, L5, S1 (Dr. Senaida Ores at [facility])  . NM MYOCAR PERF WALL MOTION  05/26/2010   Normal  . SPINE SURGERY    . US ECHOCARDIOGRAPHY  05/26/2010   mild MR   Current Outpatient Prescriptions on File Prior to Visit    Medication Sig Dispense Refill  . cyclobenzaprine (FLEXERIL) 10 MG tablet TAKE 1 TABLET BY MOUTH 3 TIMES A DAY AS NEEDED 30 tablet 1  . esomeprazole (NEXIUM) 40 MG capsule TAKE 1 CAPSULE BY MOUTH ONCE DAILY 30 capsule 11  . hydrOXYzine (VISTARIL) 25 MG capsule Take 1 capsule (25 mg total) by mouth every 6 (six) hours as needed. 60 capsule 0  . meloxicam (MOBIC) 15 MG tablet Take 15 mg by mouth daily.    . Oxycodone HCl 10 MG TABS Take 10 mg by mouth. Prn    . predniSONE (DELTASONE) 20 MG tablet 3 tabs poqday 1-2, 2 tabs poqday 3-4, 1 tab poqday 5-6 12 tablet 0  . rosuvastatin (CRESTOR) 40 MG tablet Take 1 tablet (40 mg total) by mouth daily. 90 tablet 3  . valsartan (DIOVAN) 80 MG tablet Take 1 tablet (80 mg total) by mouth daily. 30 tablet 6  . VASCEPA 1 g CAPS TAKE 2 CAPSULES BY MOUTH TWICE DAILY 120 capsule 11   No current facility-administered medications on file prior to visit.    No Known Allergies Social History   Social History  . Marital status: Divorced    Spouse name: N/A  . Number of children: N/A  . Years of education: N/A   Occupational History  . Not on file.   Social History Main Topics  . Smoking status: Current Some Day Smoker  Types: Cigarettes  . Smokeless tobacco: Never Used     Comment: smoke about 3 cigarettes daily  . Alcohol use 3.6 - 7.2 oz/week    6 - 12 Standard drinks or equivalent per week  . Drug use: No     Comment: used "pot" 25 years ago.  Marland Kitchen. Sexual activity: Not on file   Other Topics Concern  . Not on file   Social History Narrative  . No narrative on file      Review of Systems  All other systems reviewed and are negative.      Objective:   Physical Exam  Cardiovascular: Normal rate, regular rhythm and normal heart sounds.   Pulmonary/Chest: Effort normal and breath sounds normal. No respiratory distress. He has no wheezes. He has no rales.  Musculoskeletal:       Cervical back: He exhibits decreased range of motion,  tenderness, pain and spasm.       Back:  Neurological: He has normal reflexes. He exhibits normal muscle tone. Coordination normal.  Vitals reviewed.         Assessment & Plan:  Cervical radiculopathy  DDD (degenerative disc disease), cervical  It's possible, to severe neural foraminal narrowing between C6 and C7 is causing his cervical radiculopathy. However the present time, patient is not interested in any epidural steroid injection. He will try to manage the pain is best he can at the present time and notify me if he gets worse. Should he get worse, I will consult breeze per orthopedics for possible epidural steroid injections in the neck. Meanwhile discontinue valsartan and replaced with losartan 50 mg by mouth daily for hypertension

## 2016-12-28 ENCOUNTER — Telehealth: Payer: Self-pay | Admitting: Family Medicine

## 2016-12-28 DIAGNOSIS — G894 Chronic pain syndrome: Secondary | ICD-10-CM | POA: Diagnosis not present

## 2016-12-28 DIAGNOSIS — M25512 Pain in left shoulder: Secondary | ICD-10-CM | POA: Diagnosis not present

## 2016-12-28 DIAGNOSIS — M5412 Radiculopathy, cervical region: Secondary | ICD-10-CM | POA: Diagnosis not present

## 2016-12-28 DIAGNOSIS — M5416 Radiculopathy, lumbar region: Secondary | ICD-10-CM | POA: Diagnosis not present

## 2016-12-28 DIAGNOSIS — G541 Lumbosacral plexus disorders: Secondary | ICD-10-CM | POA: Diagnosis not present

## 2016-12-28 NOTE — Telephone Encounter (Signed)
New Message  Pt voiced needing a copy to of his MRI results to pick up.  Please f/u

## 2016-12-28 NOTE — Telephone Encounter (Signed)
Results printed and placed at front desk.   Call placed to patient. No answer. No VM.

## 2017-01-01 DIAGNOSIS — M9903 Segmental and somatic dysfunction of lumbar region: Secondary | ICD-10-CM | POA: Diagnosis not present

## 2017-01-01 DIAGNOSIS — M5033 Other cervical disc degeneration, cervicothoracic region: Secondary | ICD-10-CM | POA: Diagnosis not present

## 2017-01-01 DIAGNOSIS — M9901 Segmental and somatic dysfunction of cervical region: Secondary | ICD-10-CM | POA: Diagnosis not present

## 2017-01-01 DIAGNOSIS — M5416 Radiculopathy, lumbar region: Secondary | ICD-10-CM | POA: Diagnosis not present

## 2017-01-22 ENCOUNTER — Other Ambulatory Visit: Payer: Self-pay | Admitting: Family Medicine

## 2017-01-22 DIAGNOSIS — G8929 Other chronic pain: Secondary | ICD-10-CM

## 2017-01-22 DIAGNOSIS — M545 Low back pain, unspecified: Secondary | ICD-10-CM

## 2017-01-22 NOTE — Telephone Encounter (Signed)
Ok to refill Flexeril? 

## 2017-01-22 NOTE — Telephone Encounter (Signed)
ok 

## 2017-01-22 NOTE — Telephone Encounter (Signed)
Prescription sent to pharmacy.

## 2017-01-29 DIAGNOSIS — M5412 Radiculopathy, cervical region: Secondary | ICD-10-CM | POA: Diagnosis not present

## 2017-01-29 DIAGNOSIS — M5416 Radiculopathy, lumbar region: Secondary | ICD-10-CM | POA: Diagnosis not present

## 2017-01-29 DIAGNOSIS — M25512 Pain in left shoulder: Secondary | ICD-10-CM | POA: Diagnosis not present

## 2017-01-29 DIAGNOSIS — G894 Chronic pain syndrome: Secondary | ICD-10-CM | POA: Diagnosis not present

## 2017-01-29 DIAGNOSIS — G541 Lumbosacral plexus disorders: Secondary | ICD-10-CM | POA: Diagnosis not present

## 2017-02-02 DIAGNOSIS — M5416 Radiculopathy, lumbar region: Secondary | ICD-10-CM | POA: Diagnosis not present

## 2017-02-02 DIAGNOSIS — M9901 Segmental and somatic dysfunction of cervical region: Secondary | ICD-10-CM | POA: Diagnosis not present

## 2017-02-02 DIAGNOSIS — M9903 Segmental and somatic dysfunction of lumbar region: Secondary | ICD-10-CM | POA: Diagnosis not present

## 2017-02-02 DIAGNOSIS — M5033 Other cervical disc degeneration, cervicothoracic region: Secondary | ICD-10-CM | POA: Diagnosis not present

## 2017-02-25 DIAGNOSIS — M9903 Segmental and somatic dysfunction of lumbar region: Secondary | ICD-10-CM | POA: Diagnosis not present

## 2017-02-25 DIAGNOSIS — M5416 Radiculopathy, lumbar region: Secondary | ICD-10-CM | POA: Diagnosis not present

## 2017-02-25 DIAGNOSIS — M5033 Other cervical disc degeneration, cervicothoracic region: Secondary | ICD-10-CM | POA: Diagnosis not present

## 2017-02-25 DIAGNOSIS — M9901 Segmental and somatic dysfunction of cervical region: Secondary | ICD-10-CM | POA: Diagnosis not present

## 2017-02-26 DIAGNOSIS — M542 Cervicalgia: Secondary | ICD-10-CM | POA: Diagnosis not present

## 2017-02-26 DIAGNOSIS — G894 Chronic pain syndrome: Secondary | ICD-10-CM | POA: Diagnosis not present

## 2017-02-26 DIAGNOSIS — M545 Low back pain: Secondary | ICD-10-CM | POA: Diagnosis not present

## 2017-02-26 DIAGNOSIS — Z79891 Long term (current) use of opiate analgesic: Secondary | ICD-10-CM | POA: Diagnosis not present

## 2017-03-08 ENCOUNTER — Other Ambulatory Visit: Payer: Self-pay | Admitting: Family Medicine

## 2017-03-08 DIAGNOSIS — M545 Low back pain: Principal | ICD-10-CM

## 2017-03-08 DIAGNOSIS — G8929 Other chronic pain: Secondary | ICD-10-CM

## 2017-03-08 NOTE — Telephone Encounter (Signed)
ok 

## 2017-03-08 NOTE — Telephone Encounter (Signed)
Ok to refill Flexeril? 

## 2017-03-09 MED ORDER — MELOXICAM 7.5 MG PO TABS: 7.5000 mg | ORAL_TABLET | Freq: Every day | ORAL | 0 refills | Status: DC | PRN

## 2017-03-09 NOTE — Telephone Encounter (Signed)
Prescription sent to pharmacy.

## 2017-03-09 NOTE — Addendum Note (Signed)
Addended by: Legrand RamsWILLIS, SANDY B on: 03/09/2017 09:54 AM   Modules accepted: Orders

## 2017-03-17 DIAGNOSIS — M5416 Radiculopathy, lumbar region: Secondary | ICD-10-CM | POA: Diagnosis not present

## 2017-03-17 DIAGNOSIS — M9901 Segmental and somatic dysfunction of cervical region: Secondary | ICD-10-CM | POA: Diagnosis not present

## 2017-03-17 DIAGNOSIS — M5033 Other cervical disc degeneration, cervicothoracic region: Secondary | ICD-10-CM | POA: Diagnosis not present

## 2017-03-17 DIAGNOSIS — M9903 Segmental and somatic dysfunction of lumbar region: Secondary | ICD-10-CM | POA: Diagnosis not present

## 2017-03-19 ENCOUNTER — Other Ambulatory Visit: Payer: Medicare Other

## 2017-03-19 DIAGNOSIS — F419 Anxiety disorder, unspecified: Secondary | ICD-10-CM | POA: Diagnosis not present

## 2017-03-19 DIAGNOSIS — R739 Hyperglycemia, unspecified: Secondary | ICD-10-CM

## 2017-03-19 DIAGNOSIS — I10 Essential (primary) hypertension: Secondary | ICD-10-CM | POA: Diagnosis not present

## 2017-03-19 DIAGNOSIS — E785 Hyperlipidemia, unspecified: Secondary | ICD-10-CM | POA: Diagnosis not present

## 2017-03-19 DIAGNOSIS — Z79899 Other long term (current) drug therapy: Secondary | ICD-10-CM | POA: Diagnosis not present

## 2017-03-20 LAB — COMPLETE METABOLIC PANEL WITH GFR
AG Ratio: 2 (calc) (ref 1.0–2.5)
ALKALINE PHOSPHATASE (APISO): 61 U/L (ref 40–115)
ALT: 30 U/L (ref 9–46)
AST: 25 U/L (ref 10–35)
Albumin: 4.5 g/dL (ref 3.6–5.1)
BILIRUBIN TOTAL: 0.6 mg/dL (ref 0.2–1.2)
BUN: 10 mg/dL (ref 7–25)
CHLORIDE: 105 mmol/L (ref 98–110)
CO2: 27 mmol/L (ref 20–32)
Calcium: 9.4 mg/dL (ref 8.6–10.3)
Creat: 0.84 mg/dL (ref 0.70–1.33)
GFR, EST AFRICAN AMERICAN: 115 mL/min/{1.73_m2} (ref >=60)
GFR, Est Non African American: 99 mL/min/{1.73_m2} (ref >=60)
GLUCOSE: 82 mg/dL (ref 65–99)
Globulin: 2.3 g/dL (calc) (ref 1.9–3.7)
Potassium: 4.4 mmol/L (ref 3.5–5.3)
Sodium: 139 mmol/L (ref 135–146)
TOTAL PROTEIN: 6.8 g/dL (ref 6.1–8.1)

## 2017-03-20 LAB — CBC WITH DIFFERENTIAL/PLATELET
BASOS PCT: 0.6 %
Basophils Absolute: 31 cells/uL (ref 0–200)
EOS ABS: 130 {cells}/uL (ref 15–500)
Eosinophils Relative: 2.5 %
HCT: 42.4 % (ref 38.5–50.0)
Hemoglobin: 14.8 g/dL (ref 13.2–17.1)
Lymphs Abs: 2007 cells/uL (ref 850–3900)
MCH: 32 pg (ref 27.0–33.0)
MCHC: 34.9 g/dL (ref 32.0–36.0)
MCV: 91.6 fL (ref 80.0–100.0)
MPV: 9.7 fL (ref 7.5–12.5)
Monocytes Relative: 8.8 %
Neutro Abs: 2574 cells/uL (ref 1500–7800)
Neutrophils Relative %: 49.5 %
PLATELETS: 172 10*3/uL (ref 140–400)
RBC: 4.63 10*6/uL (ref 4.20–5.80)
RDW: 12.4 % (ref 11.0–15.0)
TOTAL LYMPHOCYTE: 38.6 %
WBC: 5.2 10*3/uL (ref 3.8–10.8)
WBCMIX: 458 {cells}/uL (ref 200–950)

## 2017-03-20 LAB — LIPID PANEL
CHOLESTEROL: 223 mg/dL — AB (ref <200)
HDL: 35 mg/dL — AB (ref >40)
LDL CHOLESTEROL (CALC): 148 mg/dL — AB
Non-HDL Cholesterol (Calc): 188 mg/dL (calc) — ABNORMAL HIGH (ref <130)
TRIGLYCERIDES: 261 mg/dL — AB (ref <150)
Total CHOL/HDL Ratio: 6.4 (calc) — ABNORMAL HIGH (ref <5.0)

## 2017-03-20 LAB — HEMOGLOBIN A1C
HEMOGLOBIN A1C: 5.1 %{Hb} (ref <5.7)
Mean Plasma Glucose: 100 (calc)
eAG (mmol/L): 5.5 (calc)

## 2017-03-22 ENCOUNTER — Ambulatory Visit: Payer: Medicare Other | Admitting: Family Medicine

## 2017-03-26 ENCOUNTER — Other Ambulatory Visit: Payer: Medicare Other

## 2017-03-26 ENCOUNTER — Ambulatory Visit: Payer: Medicare Other | Admitting: Family Medicine

## 2017-03-26 ENCOUNTER — Telehealth: Payer: Self-pay | Admitting: Family Medicine

## 2017-03-26 ENCOUNTER — Ambulatory Visit (INDEPENDENT_AMBULATORY_CARE_PROVIDER_SITE_OTHER): Payer: Medicare Other | Admitting: Family Medicine

## 2017-03-26 ENCOUNTER — Encounter: Payer: Self-pay | Admitting: Family Medicine

## 2017-03-26 VITALS — BP 130/90 | HR 78 | Temp 98.4°F | Resp 18 | Ht 69.0 in | Wt 197.0 lb

## 2017-03-26 DIAGNOSIS — E7800 Pure hypercholesterolemia, unspecified: Secondary | ICD-10-CM

## 2017-03-26 DIAGNOSIS — M5412 Radiculopathy, cervical region: Secondary | ICD-10-CM | POA: Diagnosis not present

## 2017-03-26 MED ORDER — ROSUVASTATIN CALCIUM 40 MG PO TABS: 40.0000 mg | ORAL_TABLET | Freq: Every day | ORAL | 3 refills | Status: DC

## 2017-03-26 NOTE — Progress Notes (Signed)
Subjective:    Patient ID: George Mclaughlin, male    DOB: July 12, 1960, 56 y.o.   MRN: [id number]  HPI  11/13/16 Patient has a history of degenerative disc disease in the neck. MRI in 2013 revealed a small right-sided disc bulge and degenerative disc disease but no significant findings. However over the last several months, he has developed worsening neck pain now on the left side. It is up the level of C3 and C4. The pain will radiate up into his occiput. He also has burning stinging pain radiating down into his left shoulder down into his left arm. He denies any weakness in the arm. He denies any numbness in the arm. He denies any loss of grip strength. Muscle strength and sensation are normal. He denies any injury to the neck.  At that time, my plan was: Suspect cervical radiculopathy secondary to degenerative disc disease and possibly herniated disc. Begin prednisone taper pack and obtain updated x-ray of the cervical spine. If pain worsens, and x-ray shows no significant abnormality, proceed with an MRI of the cervical spine  12/18/16 MRI revealed-  1. Mildly progressed degenerative change of the cervical spine. 2. Minimal canal stenosis C6-7. 3. Neural foraminal narrowing C3-4 and C6-7: Moderate to severe on the LEFT at C6-7. The patient also needs to switch off her valsartan which has been discontinued. He continues to have pain in the middle of his neck around the level of C4-C5 but he has pain radiating into his left shoulder. He has painless range of motion however in the left shoulder.  At that time, my plan was: It's possible, to severe neural foraminal narrowing between C6 and C7 is causing his cervical radiculopathy. However the present time, patient is not interested in any epidural steroid injection. He will try to manage the pain is best he can at the present time and notify me if he gets worse. Should he get worse, I will consult breeze per orthopedics for possible epidural steroid  injections in the neck. Meanwhile discontinue valsartan and replaced with losartan 50 mg by mouth daily for hypertension  03/26/17 Patient never went for an epidural steroid injection.  He continues to complain of pain radiating from the left side of his neck into his posterior and superior left shoulder and down into his posterior tricep.  He is still not interested in epidural steroid injections.  He is currently on oxycodone 3-4 times a day under the care of a pain specialist at [facility].  He is here mainly today for follow-up of his medical problems.  He has a history of hyperlipidemia with elevated triglycerides as well as elevated LDL cholesterol.  He recently discontinued his Crestor.  His most recent lab work as listed below: Lab on 03/19/2017  Component Date Value Ref Range Status  . Glucose, Bld 03/19/2017 82  65 - 99 mg/dL Final   Comment: .            Fasting reference interval .   . BUN 03/19/2017 10  7 - 25 mg/dL Final  . Creat 78/29/5621 0.84  0.70 - 1.33 mg/dL Final   Comment: For patients >79 years of age, the reference limit for Creatinine is approximately 13% higher for people identified as African-American. .   . GFR, Est Non African American 03/19/2017 99  > OR = 60 mL/min/1.81m2 Final  . GFR, Est African American 03/19/2017 115  > OR = 60 mL/min/1.4m2 Final  . BUN/Creatinine Ratio 03/19/2017 NOT  APPLICABLE  6 - 22 (calc) Final  . Sodium 03/19/2017 139  135 - 146 mmol/L Final  . Potassium 03/19/2017 4.4  3.5 - 5.3 mmol/L Final  . Chloride 03/19/2017 105  98 - 110 mmol/L Final  . CO2 03/19/2017 27  20 - 32 mmol/L Final  . Calcium 03/19/2017 9.4  8.6 - 10.3 mg/dL Final  . Total Protein 03/19/2017 6.8  6.1 - 8.1 g/dL Final  . Albumin 16/10/960411/30/2018 4.5  3.6 - 5.1 g/dL Final  . Globulin 54/09/811911/30/2018 2.3  1.9 - 3.7 g/dL (calc) Final  . AG Ratio 03/19/2017 2.0  1.0 - 2.5 (calc) Final  . Total Bilirubin 03/19/2017 0.6  0.2 - 1.2 mg/dL Final  . Alkaline phosphatase  (APISO) 03/19/2017 61  40 - 115 U/L Final  . AST 03/19/2017 25  10 - 35 U/L Final  . ALT 03/19/2017 30  9 - 46 U/L Final  . Cholesterol 03/19/2017 223* <200 mg/dL Final  . HDL 14/78/295611/30/2018 35* >40 mg/dL Final  . Triglycerides 03/19/2017 261* <150 mg/dL Final  . LDL Cholesterol (Calc) 03/19/2017 148* mg/dL (calc) Final   Comment: Reference range: <100 . Desirable range <100 mg/dL for primary prevention;   <70 mg/dL for patients with CHD or diabetic patients  with > or = 2 CHD risk factors. Marland Kitchen. LDL-C is now calculated using the Martin-Hopkins  calculation, which is a validated novel method providing  better accuracy than the Friedewald equation in the  estimation of LDL-C.  Horald PollenMartin SS et al. Lenox AhrJAMA. 2130;865(782013;310(19): 2061-2068  (http://education.[facility].com/faq/FAQ164)   . Total CHOL/HDL Ratio 03/19/2017 6.4* <4.6<5.0 (calc) Final  . Non-HDL Cholesterol (Calc) 03/19/2017 188* <130 mg/dL (calc) Final   Comment: For patients with diabetes plus 1 major ASCVD risk  factor, treating to a non-HDL-C goal of <100 mg/dL  (LDL-C of <96<70 mg/dL) is considered a therapeutic  option.   . WBC 03/19/2017 5.2  3.8 - 10.8 Thousand/uL Final  . RBC 03/19/2017 4.63  4.20 - 5.80 Million/uL Final  . Hemoglobin 03/19/2017 14.8  13.2 - 17.1 g/dL Final  . HCT 29/52/841311/30/2018 42.4  38.5 - 50.0 % Final  . MCV 03/19/2017 91.6  80.0 - 100.0 fL Final  . MCH 03/19/2017 32.0  27.0 - 33.0 pg Final  . MCHC 03/19/2017 34.9  32.0 - 36.0 g/dL Final  . RDW 24/40/102711/30/2018 12.4  11.0 - 15.0 % Final  . Platelets 03/19/2017 172  140 - 400 Thousand/uL Final  . MPV 03/19/2017 9.7  7.5 - 12.5 fL Final  . Neutro Abs 03/19/2017 2,574  1,500 - 7,800 cells/uL Final  . Lymphs Abs 03/19/2017 2,007  850 - 3,900 cells/uL Final  . WBC mixed population 03/19/2017 458  200 - 950 cells/uL Final  . Eosinophils Absolute 03/19/2017 130  15 - 500 cells/uL Final  . Basophils Absolute 03/19/2017 31  0 - 200 cells/uL Final  . Neutrophils Relative %  03/19/2017 49.5  % Final  . Total Lymphocyte 03/19/2017 38.6  % Final  . Monocytes Relative 03/19/2017 8.8  % Final  . Eosinophils Relative 03/19/2017 2.5  % Final  . Basophils Relative 03/19/2017 0.6  % Final  . Hgb A1c MFr Bld 03/19/2017 5.1  <5.7 % of total Hgb Final   Comment: For the purpose of screening for the presence of diabetes: . <5.7%       Consistent with the absence of diabetes 5.7-6.4%    Consistent with increased risk for diabetes             (  prediabetes) > or =6.5%  Consistent with diabetes . This assay result is consistent with a decreased risk of diabetes. . Currently, no consensus exists regarding use of hemoglobin A1c for diagnosis of diabetes in children. . According to American Diabetes Association (ADA) guidelines, hemoglobin A1c <7.0% represents optimal control in non-pregnant diabetic patients. Different metrics may apply to specific patient populations.  Standards of Medical Care in Diabetes(ADA). .   . Mean Plasma Glucose 03/19/2017 100  (calc) Final  . eAG (mmol/L) 03/19/2017 5.5  (calc) Final    Past Medical History:  Diagnosis Date  . Anxiety   . DDD (degenerative disc disease), cervical    left>right foraminal narrowing at C6-7  . Hyperlipidemia   . Smoker    Past Surgical History:  Procedure Laterality Date  . BACK SURGERY  1999 & 2000   laminectomy and discectomy L4, L5, S1 (Dr. Senaida Ores at [facility])  . NM MYOCAR PERF WALL MOTION  05/26/2010   Normal  . SPINE SURGERY    . US ECHOCARDIOGRAPHY  05/26/2010   mild MR   Current Outpatient Medications on File Prior to Visit  Medication Sig Dispense Refill  . cyclobenzaprine (FLEXERIL) 10 MG tablet TAKE 1 TABLET BY MOUTH 3 TIMES DAILY AS NEEDED 30 tablet 0  . esomeprazole (NEXIUM) 40 MG capsule TAKE 1 CAPSULE BY MOUTH ONCE DAILY 30 capsule 11  . hydrOXYzine (VISTARIL) 25 MG capsule Take 1 capsule (25 mg total) by mouth every 6 (six) hours as needed. 60 capsule 0  . losartan (COZAAR) 50 MG  tablet Take 1 tablet (50 mg total) by mouth daily. 90 tablet 3  . meloxicam (MOBIC) 15 MG tablet Take 15 mg by mouth daily.    . meloxicam (MOBIC) 7.5 MG tablet Take 1 tablet (7.5 mg total) daily as needed by mouth. 30 tablet 0  . Oxycodone HCl 10 MG TABS Take 10 mg by mouth. Prn    . VASCEPA 1 g CAPS TAKE 2 CAPSULES BY MOUTH TWICE DAILY 120 capsule 11   No current facility-administered medications on file prior to visit.    No Known Allergies Social History   Socioeconomic History  . Marital status: Divorced    Spouse name: Not on file  . Number of children: Not on file  . Years of education: Not on file  . Highest education level: Not on file  Social Needs  . Financial resource strain: Not on file  . Food insecurity - worry: Not on file  . Food insecurity - inability: Not on file  . Transportation needs - medical: Not on file  . Transportation needs - non-medical: Not on file  Occupational History  . Not on file  Tobacco Use  . Smoking status: Current Some Day Smoker    Types: Cigarettes  . Smokeless tobacco: Never Used  . Tobacco comment: smoke about 3 cigarettes daily  Substance and Sexual Activity  . Alcohol use: Yes    Alcohol/week: 3.6 - 7.2 oz    Types: 6 - 12 Standard drinks or equivalent per week  . Drug use: No    Comment: used "pot" 25 years ago.  Marland Kitchen Sexual activity: Not on file  Other Topics Concern  . Not on file  Social History Narrative  . Not on file      Review of Systems  All other systems reviewed and are negative.      Objective:   Physical Exam  Cardiovascular: Normal rate, regular rhythm and normal heart sounds.  Pulmonary/Chest:  Effort normal and breath sounds normal. No respiratory distress. He has no wheezes. He has no rales.  Musculoskeletal:       Cervical back: He exhibits decreased range of motion, tenderness, pain and spasm.       Back:  Neurological: He has normal reflexes. He exhibits normal muscle tone. Coordination normal.    Vitals reviewed.         Assessment & Plan:  Cervical radiculopathy  Pure hypercholesterolemia  LDL cholesterol has almost doubled since his last visit.  Total cholesterol is well above 200.  Triglycerides rose from 50 to over 200.  I have recommended that he resume rosuvastatin 40 mg a day and recheck lab work in 6 months.  I continue to offer him a referral to an orthopedic office for an epidural steroid injection for his cervical radiculopathy but he continues to decline this at the present time.

## 2017-03-26 NOTE — Telephone Encounter (Signed)
Pt wants something called in for his dry sinuses send to [facility] [facility], states that he talked to dr pickard about it at the visit today.

## 2017-03-31 MED ORDER — FLUTICASONE PROPIONATE 50 MCG/ACT NA SUSP: 2.0000 | Freq: Every day | NASAL | 6 refills | Status: DC

## 2017-03-31 NOTE — Telephone Encounter (Signed)
Medication called/sent to requested pharmacy - pt aware 

## 2017-03-31 NOTE — Telephone Encounter (Signed)
flonase 2 sprays each nostril daily

## 2017-04-09 ENCOUNTER — Other Ambulatory Visit: Payer: Self-pay | Admitting: Family Medicine

## 2017-04-09 DIAGNOSIS — M5412 Radiculopathy, cervical region: Secondary | ICD-10-CM

## 2017-04-15 DIAGNOSIS — M5416 Radiculopathy, lumbar region: Secondary | ICD-10-CM | POA: Diagnosis not present

## 2017-04-15 DIAGNOSIS — M9901 Segmental and somatic dysfunction of cervical region: Secondary | ICD-10-CM | POA: Diagnosis not present

## 2017-04-15 DIAGNOSIS — M9903 Segmental and somatic dysfunction of lumbar region: Secondary | ICD-10-CM | POA: Diagnosis not present

## 2017-04-15 DIAGNOSIS — M5033 Other cervical disc degeneration, cervicothoracic region: Secondary | ICD-10-CM | POA: Diagnosis not present

## 2017-04-27 ENCOUNTER — Ambulatory Visit: Payer: Medicare Other | Admitting: Family Medicine

## 2017-04-30 DIAGNOSIS — M542 Cervicalgia: Secondary | ICD-10-CM | POA: Diagnosis not present

## 2017-04-30 DIAGNOSIS — M545 Low back pain: Secondary | ICD-10-CM | POA: Diagnosis not present

## 2017-04-30 DIAGNOSIS — G894 Chronic pain syndrome: Secondary | ICD-10-CM | POA: Diagnosis not present

## 2017-05-07 ENCOUNTER — Other Ambulatory Visit: Payer: Self-pay | Admitting: Family Medicine

## 2017-05-07 DIAGNOSIS — M545 Low back pain: Principal | ICD-10-CM

## 2017-05-07 DIAGNOSIS — G8929 Other chronic pain: Secondary | ICD-10-CM

## 2017-05-07 NOTE — Telephone Encounter (Signed)
Ok to refill Flexeril? 

## 2017-05-17 DIAGNOSIS — L918 Other hypertrophic disorders of the skin: Secondary | ICD-10-CM | POA: Diagnosis not present

## 2017-05-17 DIAGNOSIS — M9903 Segmental and somatic dysfunction of lumbar region: Secondary | ICD-10-CM | POA: Diagnosis not present

## 2017-05-17 DIAGNOSIS — M5033 Other cervical disc degeneration, cervicothoracic region: Secondary | ICD-10-CM | POA: Diagnosis not present

## 2017-05-17 DIAGNOSIS — M5416 Radiculopathy, lumbar region: Secondary | ICD-10-CM | POA: Diagnosis not present

## 2017-05-17 DIAGNOSIS — M9901 Segmental and somatic dysfunction of cervical region: Secondary | ICD-10-CM | POA: Diagnosis not present

## 2017-05-26 DIAGNOSIS — G894 Chronic pain syndrome: Secondary | ICD-10-CM | POA: Diagnosis not present

## 2017-05-26 DIAGNOSIS — M545 Low back pain: Secondary | ICD-10-CM | POA: Diagnosis not present

## 2017-05-26 DIAGNOSIS — M542 Cervicalgia: Secondary | ICD-10-CM | POA: Diagnosis not present

## 2017-06-08 DIAGNOSIS — M9901 Segmental and somatic dysfunction of cervical region: Secondary | ICD-10-CM | POA: Diagnosis not present

## 2017-06-08 DIAGNOSIS — M5033 Other cervical disc degeneration, cervicothoracic region: Secondary | ICD-10-CM | POA: Diagnosis not present

## 2017-06-08 DIAGNOSIS — M5416 Radiculopathy, lumbar region: Secondary | ICD-10-CM | POA: Diagnosis not present

## 2017-06-08 DIAGNOSIS — M9903 Segmental and somatic dysfunction of lumbar region: Secondary | ICD-10-CM | POA: Diagnosis not present

## 2017-06-25 DIAGNOSIS — M542 Cervicalgia: Secondary | ICD-10-CM | POA: Diagnosis not present

## 2017-06-25 DIAGNOSIS — M545 Low back pain: Secondary | ICD-10-CM | POA: Diagnosis not present

## 2017-06-25 DIAGNOSIS — G894 Chronic pain syndrome: Secondary | ICD-10-CM | POA: Diagnosis not present

## 2017-06-30 DIAGNOSIS — F4320 Adjustment disorder, unspecified: Secondary | ICD-10-CM | POA: Diagnosis not present

## 2017-07-09 ENCOUNTER — Ambulatory Visit (INDEPENDENT_AMBULATORY_CARE_PROVIDER_SITE_OTHER): Payer: Medicare Other | Admitting: Family Medicine

## 2017-07-09 ENCOUNTER — Encounter: Payer: Self-pay | Admitting: Family Medicine

## 2017-07-09 VITALS — BP 160/90 | HR 106 | Temp 98.0°F | Resp 18 | Ht 69.0 in | Wt 186.0 lb

## 2017-07-09 DIAGNOSIS — M5030 Other cervical disc degeneration, unspecified cervical region: Secondary | ICD-10-CM | POA: Diagnosis not present

## 2017-07-09 DIAGNOSIS — I10 Essential (primary) hypertension: Secondary | ICD-10-CM

## 2017-07-09 DIAGNOSIS — M5412 Radiculopathy, cervical region: Secondary | ICD-10-CM

## 2017-07-09 MED ORDER — HYDROCHLOROTHIAZIDE 25 MG PO TABS: 25.0000 mg | ORAL_TABLET | Freq: Every day | ORAL | 3 refills | Status: DC

## 2017-07-09 MED ORDER — OXYCODONE HCL 10 MG PO TABS: 10.0000 mg | ORAL_TABLET | Freq: Three times a day (TID) | ORAL | 0 refills | Status: DC | PRN

## 2017-07-09 MED ORDER — OLOPATADINE HCL 0.2 % OP SOLN: 1.0000 [drp] | Freq: Every day | OPHTHALMIC | 3 refills | Status: DC

## 2017-07-09 NOTE — Progress Notes (Signed)
Subjective:    Patient ID: George Mclaughlin, male    DOB: 07/06/1960, 57 y.o.   MRN: [id number]  Medication Refill     11/13/16 Patient has a history of degenerative disc disease in the neck. MRI in 2013 revealed a small right-sided disc bulge and degenerative disc disease but no significant findings. However over the last several months, he has developed worsening neck pain now on the left side. It is up the level of C3 and C4. The pain will radiate up into his occiput. He also has burning stinging pain radiating down into his left shoulder down into his left arm. He denies any weakness in the arm. He denies any numbness in the arm. He denies any loss of grip strength. Muscle strength and sensation are normal. He denies any injury to the neck.  At that time, my plan was: Suspect cervical radiculopathy secondary to degenerative disc disease and possibly herniated disc. Begin prednisone taper pack and obtain updated x-ray of the cervical spine. If pain worsens, and x-ray shows no significant abnormality, proceed with an MRI of the cervical spine  12/18/16 MRI revealed-  1. Mildly progressed degenerative change of the cervical spine. 2. Minimal canal stenosis C6-7. 3. Neural foraminal narrowing C3-4 and C6-7: Moderate to severe on the LEFT at C6-7. The patient also needs to switch off her valsartan which has been discontinued. He continues to have pain in the middle of his neck around the level of C4-C5 but he has pain radiating into his left shoulder. He has painless range of motion however in the left shoulder.  At that time, my plan was: It's possible, to severe neural foraminal narrowing between C6 and C7 is causing his cervical radiculopathy. However the present time, patient is not interested in any epidural steroid injection. He will try to manage the pain is best he can at the present time and notify me if he gets worse. Should he get worse, I will consult [facility] per orthopedics for possible  epidural steroid injections in the neck. Meanwhile discontinue valsartan and replaced with losartan 50 mg by mouth daily for hypertension  03/26/17 Patient never went for an epidural steroid injection.  He continues to complain of pain radiating from the left side of his neck into his posterior and superior left shoulder and down into his posterior tricep.  He is still not interested in epidural steroid injections.  He is currently on oxycodone 3-4 times a day under the care of a pain specialist at [facility].  He is here mainly today for follow-up of his medical problems.  He has a history of hyperlipidemia with elevated triglycerides as well as elevated LDL cholesterol.  He recently discontinued his Crestor.  His most recent lab work as listed below: No visits with results within 1 Month(s) from this visit.  Latest known visit with results is:  Lab on 03/19/2017  Component Date Value Ref Range Status  . Glucose, Bld 03/19/2017 82  65 - 99 mg/dL Final   Comment: .            Fasting reference interval .   . BUN 03/19/2017 10  7 - 25 mg/dL Final  . Creat 30/86/5784 0.84  0.70 - 1.33 mg/dL Final   Comment: For patients >32 years of age, the reference limit for Creatinine is approximately 13% higher for people identified as African-American. .   . GFR, Est Non African American 03/19/2017 99  > OR = 60 mL/min/1.47m2  Final  . GFR, Est African American 03/19/2017 115  > OR = 60 mL/min/1.61m2 Final  . BUN/Creatinine Ratio 03/19/2017 NOT APPLICABLE  6 - 22 (calc) Final  . Sodium 03/19/2017 139  135 - 146 mmol/L Final  . Potassium 03/19/2017 4.4  3.5 - 5.3 mmol/L Final  . Chloride 03/19/2017 105  98 - 110 mmol/L Final  . CO2 03/19/2017 27  20 - 32 mmol/L Final  . Calcium 03/19/2017 9.4  8.6 - 10.3 mg/dL Final  . Total Protein 03/19/2017 6.8  6.1 - 8.1 g/dL Final  . Albumin 16/01/9603 4.5  3.6 - 5.1 g/dL Final  . Globulin 54/12/8117 2.3  1.9 - 3.7 g/dL (calc) Final  . AG Ratio  03/19/2017 2.0  1.0 - 2.5 (calc) Final  . Total Bilirubin 03/19/2017 0.6  0.2 - 1.2 mg/dL Final  . Alkaline phosphatase (APISO) 03/19/2017 61  40 - 115 U/L Final  . AST 03/19/2017 25  10 - 35 U/L Final  . ALT 03/19/2017 30  9 - 46 U/L Final  . Cholesterol 03/19/2017 223* <200 mg/dL Final  . HDL 14/78/2956 35* >40 mg/dL Final  . Triglycerides 03/19/2017 261* <150 mg/dL Final  . LDL Cholesterol (Calc) 03/19/2017 148* mg/dL (calc) Final   Comment: Reference range: <100 . Desirable range <100 mg/dL for primary prevention;   <70 mg/dL for patients with CHD or diabetic patients  with > or = 2 CHD risk factors. Marland Kitchen LDL-C is now calculated using the Martin-Hopkins  calculation, which is a validated novel method providing  better accuracy than the Friedewald equation in the  estimation of LDL-C.  Horald Pollen et al. Lenox Ahr. 2130;865(78): 2061-2068  (http://education.[facility].com/faq/FAQ164)   . Total CHOL/HDL Ratio 03/19/2017 6.4* <4.6 (calc) Final  . Non-HDL Cholesterol (Calc) 03/19/2017 188* <130 mg/dL (calc) Final   Comment: For patients with diabetes plus 1 major ASCVD risk  factor, treating to a non-HDL-C goal of <100 mg/dL  (LDL-C of <96 mg/dL) is considered a therapeutic  option.   . WBC 03/19/2017 5.2  3.8 - 10.8 Thousand/uL Final  . RBC 03/19/2017 4.63  4.20 - 5.80 Million/uL Final  . Hemoglobin 03/19/2017 14.8  13.2 - 17.1 g/dL Final  . HCT 29/52/8413 42.4  38.5 - 50.0 % Final  . MCV 03/19/2017 91.6  80.0 - 100.0 fL Final  . MCH 03/19/2017 32.0  27.0 - 33.0 pg Final  . MCHC 03/19/2017 34.9  32.0 - 36.0 g/dL Final  . RDW 24/40/1027 12.4  11.0 - 15.0 % Final  . Platelets 03/19/2017 172  140 - 400 Thousand/uL Final  . MPV 03/19/2017 9.7  7.5 - 12.5 fL Final  . Neutro Abs 03/19/2017 2,574  1,500 - 7,800 cells/uL Final  . Lymphs Abs 03/19/2017 2,007  850 - 3,900 cells/uL Final  . WBC mixed population 03/19/2017 458  200 - 950 cells/uL Final  . Eosinophils Absolute 03/19/2017  130  15 - 500 cells/uL Final  . Basophils Absolute 03/19/2017 31  0 - 200 cells/uL Final  . Neutrophils Relative % 03/19/2017 49.5  % Final  . Total Lymphocyte 03/19/2017 38.6  % Final  . Monocytes Relative 03/19/2017 8.8  % Final  . Eosinophils Relative 03/19/2017 2.5  % Final  . Basophils Relative 03/19/2017 0.6  % Final  . Hgb A1c MFr Bld 03/19/2017 5.1  <5.7 % of total Hgb Final   Comment: For the purpose of screening for the presence of diabetes: . <5.7%       Consistent with the  absence of diabetes 5.7-6.4%    Consistent with increased risk for diabetes             (prediabetes) > or =6.5%  Consistent with diabetes . This assay result is consistent with a decreased risk of diabetes. . Currently, no consensus exists regarding use of hemoglobin A1c for diagnosis of diabetes in children. . According to American Diabetes Association (ADA) guidelines, hemoglobin A1c <7.0% represents optimal control in non-pregnant diabetic patients. Different metrics may apply to specific patient populations.  Standards of Medical Care in Diabetes(ADA). .   . Mean Plasma Glucose 03/19/2017 100  (calc) Final  . eAG (mmol/L) 03/19/2017 5.5  (calc) Final  At that time, my plan was: LDL cholesterol has almost doubled since his last visit.  Total cholesterol is well above 200.  Triglycerides rose from 50 to over 200.  I have recommended that he resume rosuvastatin 40 mg a day and recheck lab work in 6 months.  I continue to offer him a referral to an orthopedic office for an epidural steroid injection for his cervical radiculopathy but he continues to decline this at the present time.  07/09/17 Patient received epidural steroid injection in his neck but saw no relief per his report.  He is requesting a refill on his oxycodone which he takes for neck and back pain (120 tabs a month).  He has been out for 2 weeks.  He and I had agreed to a reduction of 90 tabs permonth if I were to assume writing his pain  medications.  Denies using illicit drugs.  Denies diversion.  Reports dry eyes which burn and itch made worse with allergies as well.  Also, BP is very high today.  Denies chest pain, sob, or doe.    Past Medical History:  Diagnosis Date  . Anxiety   . DDD (degenerative disc disease), cervical    left>right foraminal narrowing at C6-7  . Hyperlipidemia   . Smoker    Past Surgical History:  Procedure Laterality Date  . BACK SURGERY  1999 & 2000   laminectomy and discectomy L4, L5, S1 [facility]  . NM MYOCAR PERF WALL MOTION  05/26/2010   Normal  . SPINE SURGERY    . US ECHOCARDIOGRAPHY  05/26/2010   mild MR   Current Outpatient Medications on File Prior to Visit  Medication Sig Dispense Refill  . cyclobenzaprine (FLEXERIL) 10 MG tablet TAKE 1 TABLET BY MOUTH 3 TIMES DAILY AS NEEDED 30 tablet 0  . esomeprazole (NEXIUM) 40 MG capsule TAKE 1 CAPSULE BY MOUTH ONCE DAILY 30 capsule 11  . losartan (COZAAR) 50 MG tablet Take 1 tablet (50 mg total) by mouth daily. 90 tablet 3  . meloxicam (MOBIC) 15 MG tablet Take 15 mg by mouth daily.    . rosuvastatin (CRESTOR) 40 MG tablet Take 1 tablet (40 mg total) by mouth daily. 90 tablet 3  . VASCEPA 1 g CAPS TAKE 2 CAPSULES BY MOUTH TWICE DAILY 120 capsule 11   No current facility-administered medications on file prior to visit.    No Known Allergies Social History   Socioeconomic History  . Marital status: Divorced    Spouse name: Not on file  . Number of children: Not on file  . Years of education: Not on file  . Highest education level: Not on file  Occupational History  . Not on file  Social Needs  . Financial resource strain: Not on file  . Food insecurity:  Worry: Not on file    Inability: Not on file  . Transportation needs:    Medical: Not on file    Non-medical: Not on file  Tobacco Use  . Smoking status: Current Some Day Smoker    Types: Cigarettes  . Smokeless tobacco: Never Used  . Tobacco comment: smoke  about 3 cigarettes daily  Substance and Sexual Activity  . Alcohol use: Yes    Alcohol/week: 3.6 - 7.2 oz    Types: 6 - 12 Standard drinks or equivalent per week  . Drug use: No    Comment: used "pot" 25 years ago.  Marland Kitchen Sexual activity: Not on file  Lifestyle  . Physical activity:    Days per week: Not on file    Minutes per session: Not on file  . Stress: Not on file  Relationships  . Social connections:    Talks on phone: Not on file    Gets together: Not on file    Attends religious service: Not on file    Active member of club or organization: Not on file    Attends meetings of clubs or organizations: Not on file    Relationship status: Not on file  . Intimate partner violence:    Fear of current or ex partner: Not on file    Emotionally abused: Not on file    Physically abused: Not on file    Forced sexual activity: Not on file  Other Topics Concern  . Not on file  Social History Narrative  . Not on file      Review of Systems  All other systems reviewed and are negative.      Objective:   Physical Exam  Cardiovascular: Normal rate, regular rhythm and normal heart sounds.  Pulmonary/Chest: Effort normal and breath sounds normal. No respiratory distress. He has no wheezes. He has no rales.  Musculoskeletal:       Cervical back: He exhibits decreased range of motion, tenderness, pain and spasm.       Back:  Neurological: He has normal reflexes. He exhibits normal muscle tone. Coordination normal.  Vitals reviewed.         Assessment & Plan:  Cervical radiculopathy - Plan: Drugs of abuse screen w/o alc, rtn urine-sln  DDD (degenerative disc disease), cervical  Benign essential HTN  Patient consents to UDS.  Has been off pain medication for 2 weeks.  I did refill his oxycodone 10 mg potid 90 tabs.  BP is too high on losartan alone.  Add HCTZ 25 mg a day and recheck in 1 month.  Treat allergic conjunctiivitis vs chronic dry eyes.  Trial of pataday 1 gtt  each eye daily.

## 2017-07-10 LAB — DRUGS OF ABUSE SCREEN W/O ALC, ROUTINE URINE
AMPHETAMINES (1000 ng/mL SCRN): NEGATIVE
BARBITURATES: NEGATIVE
BENZODIAZEPINES: NEGATIVE
COCAINE METABOLITES: NEGATIVE
MARIJUANA MET (50 NG/ML SCRN): NEGATIVE
METHADONE: NEGATIVE
METHAQUALONE: NEGATIVE
OPIATES: NEGATIVE
PHENCYCLIDINE: NEGATIVE
PROPOXYPHENE: NEGATIVE

## 2017-07-15 ENCOUNTER — Other Ambulatory Visit: Payer: Self-pay | Admitting: Family Medicine

## 2017-07-15 DIAGNOSIS — G8929 Other chronic pain: Secondary | ICD-10-CM

## 2017-07-15 DIAGNOSIS — M545 Low back pain: Principal | ICD-10-CM

## 2017-07-15 NOTE — Telephone Encounter (Signed)
Ok to refill 

## 2017-07-16 DIAGNOSIS — M9903 Segmental and somatic dysfunction of lumbar region: Secondary | ICD-10-CM | POA: Diagnosis not present

## 2017-07-16 DIAGNOSIS — M9901 Segmental and somatic dysfunction of cervical region: Secondary | ICD-10-CM | POA: Diagnosis not present

## 2017-07-16 DIAGNOSIS — M5416 Radiculopathy, lumbar region: Secondary | ICD-10-CM | POA: Diagnosis not present

## 2017-07-16 DIAGNOSIS — M5033 Other cervical disc degeneration, cervicothoracic region: Secondary | ICD-10-CM | POA: Diagnosis not present

## 2017-08-02 ENCOUNTER — Other Ambulatory Visit: Payer: Self-pay | Admitting: Family Medicine

## 2017-08-03 NOTE — Telephone Encounter (Signed)
Patient requesting a refill on Oxycodone     LOV: 07/09/17  LRF:    07/09/17

## 2017-08-20 DIAGNOSIS — M5033 Other cervical disc degeneration, cervicothoracic region: Secondary | ICD-10-CM | POA: Diagnosis not present

## 2017-08-20 DIAGNOSIS — M9901 Segmental and somatic dysfunction of cervical region: Secondary | ICD-10-CM | POA: Diagnosis not present

## 2017-08-20 DIAGNOSIS — M9903 Segmental and somatic dysfunction of lumbar region: Secondary | ICD-10-CM | POA: Diagnosis not present

## 2017-08-20 DIAGNOSIS — M5416 Radiculopathy, lumbar region: Secondary | ICD-10-CM | POA: Diagnosis not present

## 2017-09-01 ENCOUNTER — Other Ambulatory Visit: Payer: Self-pay | Admitting: Family Medicine

## 2017-09-01 DIAGNOSIS — G8929 Other chronic pain: Secondary | ICD-10-CM

## 2017-09-01 DIAGNOSIS — M545 Low back pain, unspecified: Secondary | ICD-10-CM

## 2017-09-01 NOTE — Telephone Encounter (Signed)
Ok to refill??  Last office visit 07/09/2017.  Last refill on Oxycodone 08/03/2017.

## 2017-09-03 ENCOUNTER — Other Ambulatory Visit: Payer: Medicare Other

## 2017-09-03 DIAGNOSIS — E7800 Pure hypercholesterolemia, unspecified: Secondary | ICD-10-CM

## 2017-09-03 DIAGNOSIS — Z125 Encounter for screening for malignant neoplasm of prostate: Secondary | ICD-10-CM | POA: Diagnosis not present

## 2017-09-03 DIAGNOSIS — I10 Essential (primary) hypertension: Secondary | ICD-10-CM

## 2017-09-03 DIAGNOSIS — R7309 Other abnormal glucose: Secondary | ICD-10-CM

## 2017-09-03 DIAGNOSIS — Z0000 Encounter for general adult medical examination without abnormal findings: Secondary | ICD-10-CM

## 2017-09-04 LAB — CBC WITH DIFFERENTIAL/PLATELET
BASOS PCT: 0.5 %
Basophils Absolute: 32 cells/uL (ref 0–200)
EOS ABS: 230 {cells}/uL (ref 15–500)
Eosinophils Relative: 3.6 %
HCT: 42.2 % (ref 38.5–50.0)
Hemoglobin: 14.8 g/dL (ref 13.2–17.1)
Lymphs Abs: 2086 cells/uL (ref 850–3900)
MCH: 32.1 pg (ref 27.0–33.0)
MCHC: 35.1 g/dL (ref 32.0–36.0)
MCV: 91.5 fL (ref 80.0–100.0)
MONOS PCT: 9.7 %
MPV: 10 fL (ref 7.5–12.5)
Neutro Abs: 3430 cells/uL (ref 1500–7800)
Neutrophils Relative %: 53.6 %
PLATELETS: 164 10*3/uL (ref 140–400)
RBC: 4.61 10*6/uL (ref 4.20–5.80)
RDW: 12.6 % (ref 11.0–15.0)
TOTAL LYMPHOCYTE: 32.6 %
WBC mixed population: 621 cells/uL (ref 200–950)
WBC: 6.4 10*3/uL (ref 3.8–10.8)

## 2017-09-04 LAB — LIPID PANEL
Cholesterol: 140 mg/dL (ref <200)
HDL: 51 mg/dL (ref >40)
LDL CHOLESTEROL (CALC): 74 mg/dL
NON-HDL CHOLESTEROL (CALC): 89 mg/dL (ref <130)
TRIGLYCERIDES: 73 mg/dL (ref <150)
Total CHOL/HDL Ratio: 2.7 (calc) (ref <5.0)

## 2017-09-04 LAB — COMPLETE METABOLIC PANEL WITH GFR
AG RATIO: 1.9 (calc) (ref 1.0–2.5)
ALT: 28 U/L (ref 9–46)
AST: 30 U/L (ref 10–35)
Albumin: 4.6 g/dL (ref 3.6–5.1)
Alkaline phosphatase (APISO): 49 U/L (ref 40–115)
BUN: 14 mg/dL (ref 7–25)
CO2: 29 mmol/L (ref 20–32)
Calcium: 9.6 mg/dL (ref 8.6–10.3)
Chloride: 103 mmol/L (ref 98–110)
Creat: 1.07 mg/dL (ref 0.70–1.33)
GFR, EST AFRICAN AMERICAN: 91 mL/min/{1.73_m2} (ref >=60)
GFR, Est Non African American: 78 mL/min/{1.73_m2} (ref >=60)
GLUCOSE: 97 mg/dL (ref 65–99)
Globulin: 2.4 g/dL (calc) (ref 1.9–3.7)
Potassium: 4.9 mmol/L (ref 3.5–5.3)
Sodium: 139 mmol/L (ref 135–146)
TOTAL PROTEIN: 7 g/dL (ref 6.1–8.1)
Total Bilirubin: 0.6 mg/dL (ref 0.2–1.2)

## 2017-09-04 LAB — PSA: PSA: 0.7 ng/mL (ref <=4.0)

## 2017-09-04 LAB — HEMOGLOBIN A1C
Hgb A1c MFr Bld: 5.3 % of total Hgb (ref <5.7)
Mean Plasma Glucose: 105 (calc)
eAG (mmol/L): 5.8 (calc)

## 2017-09-07 ENCOUNTER — Ambulatory Visit (INDEPENDENT_AMBULATORY_CARE_PROVIDER_SITE_OTHER): Payer: Medicare Other | Admitting: Family Medicine

## 2017-09-07 ENCOUNTER — Other Ambulatory Visit: Payer: Self-pay

## 2017-09-07 ENCOUNTER — Encounter: Payer: Self-pay | Admitting: Family Medicine

## 2017-09-07 VITALS — BP 148/84 | HR 81 | Temp 98.2°F | Ht 69.0 in | Wt 188.0 lb

## 2017-09-07 DIAGNOSIS — I10 Essential (primary) hypertension: Secondary | ICD-10-CM | POA: Diagnosis not present

## 2017-09-07 DIAGNOSIS — E7800 Pure hypercholesterolemia, unspecified: Secondary | ICD-10-CM | POA: Diagnosis not present

## 2017-09-07 NOTE — Progress Notes (Signed)
Subjective:    Patient ID: George Mclaughlin, male    DOB: 05/31/1960, 57 y.o.   MRN: [id number]  Medication Refill   Hyperlipidemia     The patient's last office visit, I recommended that he add hydrochlorothiazide to losartan.  The patient was confused and discontinued the losartan but added the hydrochlorothiazide.  As a result his blood pressure is still elevated at 148/84.  In November we checked a fasting lipid panel and his total cholesterol was extremely high.  His LDL cholesterol was elevated.  His HDL cholesterol was low.  I recommended that the patient begin taking a statin medication.  Based on his lab work from November, his 10-year risk of coronary artery disease was calculated to be 22%.  His most recent lab work as listed below.  With the reduction in his cholesterol, even with his elevated blood pressure, his ten-year risk of heart disease has been reduced to 9%.  The risk remains due to his elevated blood pressure and his continued smoking.: Lab on 09/03/2017  Component Date Value Ref Range Status  . WBC 09/03/2017 6.4  3.8 - 10.8 Thousand/uL Final  . RBC 09/03/2017 4.61  4.20 - 5.80 Million/uL Final  . Hemoglobin 09/03/2017 14.8  13.2 - 17.1 g/dL Final  . HCT 78/29/5621 42.2  38.5 - 50.0 % Final  . MCV 09/03/2017 91.5  80.0 - 100.0 fL Final  . MCH 09/03/2017 32.1  27.0 - 33.0 pg Final  . MCHC 09/03/2017 35.1  32.0 - 36.0 g/dL Final  . RDW 30/86/5784 12.6  11.0 - 15.0 % Final  . Platelets 09/03/2017 164  140 - 400 Thousand/uL Final  . MPV 09/03/2017 10.0  7.5 - 12.5 fL Final  . Neutro Abs 09/03/2017 3,430  1,500 - 7,800 cells/uL Final  . Lymphs Abs 09/03/2017 2,086  850 - 3,900 cells/uL Final  . WBC mixed population 09/03/2017 621  200 - 950 cells/uL Final  . Eosinophils Absolute 09/03/2017 230  15 - 500 cells/uL Final  . Basophils Absolute 09/03/2017 32  0 - 200 cells/uL Final  . Neutrophils Relative % 09/03/2017 53.6  % Final  . Total Lymphocyte 09/03/2017 32.6  %  Final  . Monocytes Relative 09/03/2017 9.7  % Final  . Eosinophils Relative 09/03/2017 3.6  % Final  . Basophils Relative 09/03/2017 0.5  % Final  . Glucose, Bld 09/03/2017 97  65 - 99 mg/dL Final   Comment: .            Fasting reference interval .   . BUN 09/03/2017 14  7 - 25 mg/dL Final  . Creat 69/62/9528 1.07  0.70 - 1.33 mg/dL Final   Comment: For patients >54 years of age, the reference limit for Creatinine is approximately 13% higher for people identified as African-American. .   . GFR, Est Non African American 09/03/2017 78  > OR = 60 mL/min/1.25m2 Final  . GFR, Est African American 09/03/2017 91  > OR = 60 mL/min/1.64m2 Final  . BUN/Creatinine Ratio 09/03/2017 NOT APPLICABLE  6 - 22 (calc) Final  . Sodium 09/03/2017 139  135 - 146 mmol/L Final  . Potassium 09/03/2017 4.9  3.5 - 5.3 mmol/L Final  . Chloride 09/03/2017 103  98 - 110 mmol/L Final  . CO2 09/03/2017 29  20 - 32 mmol/L Final  . Calcium 09/03/2017 9.6  8.6 - 10.3 mg/dL Final  . Total Protein 09/03/2017 7.0  6.1 - 8.1 g/dL Final  . Albumin 41/32/4401 4.6  3.6 - 5.1 g/dL Final  . Globulin 40/98/1191 2.4  1.9 - 3.7 g/dL (calc) Final  . AG Ratio 09/03/2017 1.9  1.0 - 2.5 (calc) Final  . Total Bilirubin 09/03/2017 0.6  0.2 - 1.2 mg/dL Final  . Alkaline phosphatase (APISO) 09/03/2017 49  40 - 115 U/L Final  . AST 09/03/2017 30  10 - 35 U/L Final  . ALT 09/03/2017 28  9 - 46 U/L Final  . Cholesterol 09/03/2017 140  <200 mg/dL Final  . HDL 47/82/9562 51  >40 mg/dL Final  . Triglycerides 09/03/2017 73  <150 mg/dL Final  . LDL Cholesterol (Calc) 09/03/2017 74  mg/dL (calc) Final   Comment: Reference range: <100 . Desirable range <100 mg/dL for primary prevention;   <70 mg/dL for patients with CHD or diabetic patients  with > or = 2 CHD risk factors. Marland Kitchen LDL-C is now calculated using the Martin-Hopkins  calculation, which is a validated novel method providing  better accuracy than the Friedewald equation in the    estimation of LDL-C.  Horald Pollen et al. Lenox Ahr. 1308;657(84): 2061-2068  (http://education.[facility].com/faq/FAQ164)   . Total CHOL/HDL Ratio 09/03/2017 2.7  <6.9 (calc) Final  . Non-HDL Cholesterol (Calc) 09/03/2017 89  <130 mg/dL (calc) Final   Comment: For patients with diabetes plus 1 major ASCVD risk  factor, treating to a non-HDL-C goal of <100 mg/dL  (LDL-C of <62 mg/dL) is considered a therapeutic  option.   . Hgb A1c MFr Bld 09/03/2017 5.3  <5.7 % of total Hgb Final   Comment: For the purpose of screening for the presence of diabetes: . <5.7%       Consistent with the absence of diabetes 5.7-6.4%    Consistent with increased risk for diabetes             (prediabetes) > or =6.5%  Consistent with diabetes . This assay result is consistent with a decreased risk of diabetes. . Currently, no consensus exists regarding use of hemoglobin A1c for diagnosis of diabetes in children. . According to American Diabetes Association (ADA) guidelines, hemoglobin A1c <7.0% represents optimal control in non-pregnant diabetic patients. Different metrics may apply to specific patient populations.  Standards of Medical Care in Diabetes(ADA). .   . Mean Plasma Glucose 09/03/2017 105  (calc) Final  . eAG (mmol/L) 09/03/2017 5.8  (calc) Final  . PSA 09/03/2017 0.7  < OR = 4.0 ng/mL Final   Comment: The total PSA value from this assay system is  standardized against the WHO standard. The test  result will be approximately 20% lower when compared  to the equimolar-standardized total PSA (Beckman  Coulter). Comparison of serial PSA results should be  interpreted with this fact in mind. . This test was performed using the Siemens  chemiluminescent method. Values obtained from  different assay methods cannot be used interchangeably. PSA levels, regardless of value, should not be interpreted as absolute evidence of the presence or absence of disease.     Past Medical History:   Diagnosis Date  . Anxiety   . DDD (degenerative disc disease), cervical    left>right foraminal narrowing at C6-7  . Hyperlipidemia   . Smoker    Past Surgical History:  Procedure Laterality Date  . BACK SURGERY  1999 & 2000   laminectomy and discectomy L4, L5, S1 (Dr. Senaida [facility]res at [facility])  . NM MYOCAR PERF WALL MOTION  05/26/2010   Normal  . SPINE SURGERY    . US ECHOCARDIOGRAPHY  05/26/2010  mild MR   Current Outpatient Medications on File Prior to Visit  Medication Sig Dispense Refill  . cyclobenzaprine (FLEXERIL) 10 MG tablet TAKE 1 TABLET BY MOUTH 3 TIMES A DAY AS NEEDED 30 tablet 0  . esomeprazole (NEXIUM) 40 MG capsule TAKE 1 CAPSULE BY MOUTH ONCE DAILY 30 capsule 11  . hydrochlorothiazide (HYDRODIURIL) 25 MG tablet Take 1 tablet (25 mg total) by mouth daily. 90 tablet 3  . meloxicam (MOBIC) 7.5 MG tablet TAKE 1 TABLET BY MOUTH ONCE DAILY AS NEEDED 30 tablet 0  . Olopatadine HCl (PATADAY) 0.2 % SOLN Apply 1 drop to eye daily. 2.5 mL 3  . Oxycodone HCl 10 MG TABS TAKE 1 TABLET BY MOUTH 3 TIMES DAILY AS NEEDED 90 each 0  . rosuvastatin (CRESTOR) 40 MG tablet Take 1 tablet (40 mg total) by mouth daily. 90 tablet 3  . VASCEPA 1 g CAPS TAKE 2 CAPSULES BY MOUTH TWICE DAILY 120 capsule 11  . losartan (COZAAR) 50 MG tablet Take 1 tablet (50 mg total) by mouth daily. (Patient not taking: Reported on 09/07/2017) 90 tablet 3   No current facility-administered medications on file prior to visit.    No Known Allergies Social History   Socioeconomic History  . Marital status: Divorced    Spouse name: Not on file  . Number of children: Not on file  . Years of education: Not on file  . Highest education level: Not on file  Occupational History  . Not on file  Social Needs  . Financial resource strain: Not on file  . Food insecurity:    Worry: Not on file    Inability: Not on file  . Transportation needs:    Medical: Not on file    Non-medical: Not on file  Tobacco Use  .  Smoking status: Current Some Day Smoker    Types: Cigarettes  . Smokeless tobacco: Never Used  . Tobacco comment: smoke about 3 cigarettes daily  Substance and Sexual Activity  . Alcohol use: Yes    Alcohol/week: 3.6 - 7.2 oz    Types: 6 - 12 Standard drinks or equivalent per week  . Drug use: No    Comment: used "pot" 25 years ago.  Marland Kitchen Sexual activity: Not on file  Lifestyle  . Physical activity:    Days per week: Not on file    Minutes per session: Not on file  . Stress: Not on file  Relationships  . Social connections:    Talks on phone: Not on file    Gets together: Not on file    Attends religious service: Not on file    Active member of club or organization: Not on file    Attends meetings of clubs or organizations: Not on file    Relationship status: Not on file  . Intimate partner violence:    Fear of current or ex partner: Not on file    Emotionally abused: Not on file    Physically abused: Not on file    Forced sexual activity: Not on file  Other Topics Concern  . Not on file  Social History Narrative  . Not on file      Review of Systems  All other systems reviewed and are negative.      Objective:   Physical Exam  Cardiovascular: Normal rate, regular rhythm and normal heart sounds.  Pulmonary/Chest: Effort normal and breath sounds normal. No respiratory distress. He has no wheezes. He has no rales.  Musculoskeletal:  Cervical back: He exhibits decreased range of motion, tenderness, pain and spasm.       Back:  Neurological: He has normal reflexes. He exhibits normal muscle tone. Coordination normal.  Vitals reviewed.         Assessment & Plan:  Benign essential HTN  Pure hypercholesterolemia  I am extremely happy with his cholesterol and his reduction in 10-year cardiovascular disease risk.  However his blood pressure is still elevated.  I recommended that he take losartan and hydrochlorothiazide and I believe his blood pressure will be  well controlled.  Also recommended smoking cessation.  I demonstrated the patient that if he quit smoking, and reduces his blood pressure, his ten-year risk of cardiovascular disease can be reduced as low as 4%.  Patient states that he will work on this.

## 2017-09-09 ENCOUNTER — Ambulatory Visit: Payer: Medicare Other | Admitting: Family Medicine

## 2017-09-28 ENCOUNTER — Other Ambulatory Visit: Payer: Self-pay | Admitting: Family Medicine

## 2017-09-28 NOTE — Telephone Encounter (Signed)
Ok to refill??  Last office visit 09/07/2017.  Last refill 09/02/2017.

## 2017-10-01 DIAGNOSIS — M5033 Other cervical disc degeneration, cervicothoracic region: Secondary | ICD-10-CM | POA: Diagnosis not present

## 2017-10-01 DIAGNOSIS — M9903 Segmental and somatic dysfunction of lumbar region: Secondary | ICD-10-CM | POA: Diagnosis not present

## 2017-10-01 DIAGNOSIS — M5416 Radiculopathy, lumbar region: Secondary | ICD-10-CM | POA: Diagnosis not present

## 2017-10-01 DIAGNOSIS — M9901 Segmental and somatic dysfunction of cervical region: Secondary | ICD-10-CM | POA: Diagnosis not present

## 2017-10-16 ENCOUNTER — Other Ambulatory Visit: Payer: Self-pay | Admitting: Family Medicine

## 2017-10-16 DIAGNOSIS — G8929 Other chronic pain: Secondary | ICD-10-CM

## 2017-10-16 DIAGNOSIS — M545 Low back pain, unspecified: Secondary | ICD-10-CM

## 2017-10-18 NOTE — Telephone Encounter (Signed)
Requesting refill    Flexeril  LOV: 09/07/17  LRF:  09/02/17

## 2017-10-25 ENCOUNTER — Other Ambulatory Visit: Payer: Self-pay | Admitting: Family Medicine

## 2017-10-25 NOTE — Telephone Encounter (Signed)
Patient requesting a refill on Oxycodone     LOV: 09/07/17  LRF:    09/30/17   Requesting pick up in 10/29/17 per pharm

## 2017-10-29 ENCOUNTER — Other Ambulatory Visit: Payer: Self-pay | Admitting: Family Medicine

## 2017-11-01 ENCOUNTER — Encounter: Payer: Self-pay | Admitting: Family Medicine

## 2017-11-01 ENCOUNTER — Ambulatory Visit (INDEPENDENT_AMBULATORY_CARE_PROVIDER_SITE_OTHER): Payer: Medicare Other | Admitting: Family Medicine

## 2017-11-01 VITALS — BP 140/78 | HR 66 | Temp 98.1°F | Resp 18 | Ht 69.0 in | Wt 185.0 lb

## 2017-11-01 DIAGNOSIS — M9901 Segmental and somatic dysfunction of cervical region: Secondary | ICD-10-CM | POA: Diagnosis not present

## 2017-11-01 DIAGNOSIS — M5416 Radiculopathy, lumbar region: Secondary | ICD-10-CM | POA: Diagnosis not present

## 2017-11-01 DIAGNOSIS — M9903 Segmental and somatic dysfunction of lumbar region: Secondary | ICD-10-CM | POA: Diagnosis not present

## 2017-11-01 DIAGNOSIS — F5101 Primary insomnia: Secondary | ICD-10-CM

## 2017-11-01 DIAGNOSIS — M5033 Other cervical disc degeneration, cervicothoracic region: Secondary | ICD-10-CM | POA: Diagnosis not present

## 2017-11-01 MED ORDER — AMITRIPTYLINE HCL 50 MG PO TABS: 50.0000 mg | ORAL_TABLET | Freq: Every evening | ORAL | 3 refills | Status: DC | PRN

## 2017-11-01 NOTE — Progress Notes (Signed)
Subjective:    Patient ID: George Mclaughlin, male    DOB: 12/06/1960, 57 y.o.   MRN: [id number]  Hyperlipidemia   Medication Refill     Patient originally made appointment because his eyes were irritated and he was requesting a refill on his Patanol however he has refills at his pharmacy and therefore he no longer needs to be seen for this.  However while he is here he is concerned about insomnia.  He states he wants to go back on his Xanax.  I explained to the patient that it is unsafe and unwise to combine Xanax with his opiate.  He is on oxycodone 10 mg 3-4 times a day.  This is the reason he stopped the Xanax in the first place.  He states that he occasionally needs something to help him sleep.  He is tried trazodone but does not like the way it made him feel the following day.  He is tried Atarax but saw no benefit.  After long discussion he admits that he saw some benefit from amitriptyline. Past Medical History:  Diagnosis Date  . Anxiety   . DDD (degenerative disc disease), cervical    left>right foraminal narrowing at C6-7  . Hyperlipidemia   . Smoker    Past Surgical History:  Procedure Laterality Date  . BACK SURGERY  1999 & 2000   laminectomy and discectomy L4, L5, S1 (Dr. Senaida Ores at [facility])  . NM MYOCAR PERF WALL MOTION  05/26/2010   Normal  . SPINE SURGERY    . US ECHOCARDIOGRAPHY  05/26/2010   mild MR   Current Outpatient Medications on File Prior to Visit  Medication Sig Dispense Refill  . cyclobenzaprine (FLEXERIL) 10 MG tablet TAKE 1 TABLET BY MOUTH 3 TIMES A DAY AS NEEDED 30 tablet 0  . esomeprazole (NEXIUM) 40 MG capsule TAKE 1 CAPSULE BY MOUTH ONCE DAILY 30 capsule 11  . hydrochlorothiazide (HYDRODIURIL) 25 MG tablet Take 1 tablet (25 mg total) by mouth daily. 90 tablet 3  . losartan (COZAAR) 50 MG tablet Take 1 tablet (50 mg total) by mouth daily. 90 tablet 3  . meloxicam (MOBIC) 7.5 MG tablet TAKE 1 TABLET BY MOUTH DAILY AS NEEDED 30 tablet 3  . Olopatadine  HCl (PATADAY) 0.2 % SOLN Apply 1 drop to eye daily. 2.5 mL 3  . Oxycodone HCl 10 MG TABS TAKE 1 TABLET BY MOUTH 3 TIMES A DAY AS NEEDED 90 each 0  . rosuvastatin (CRESTOR) 40 MG tablet Take 1 tablet (40 mg total) by mouth daily. 90 tablet 3  . VASCEPA 1 g CAPS TAKE 2 CAPSULES BY MOUTH TWICE DAILY 120 capsule 11   No current facility-administered medications on file prior to visit.    No Known Allergies Social History   Socioeconomic History  . Marital status: Divorced    Spouse name: Not on file  . Number of children: Not on file  . Years of education: Not on file  . Highest education level: Not on file  Occupational History  . Not on file  Social Needs  . Financial resource strain: Not on file  . Food insecurity:    Worry: Not on file    Inability: Not on file  . Transportation needs:    Medical: Not on file    Non-medical: Not on file  Tobacco Use  . Smoking status: Current Some Day Smoker    Types: Cigarettes  . Smokeless tobacco: Never Used  . Tobacco comment: smoke  about 3 cigarettes daily  Substance and Sexual Activity  . Alcohol use: Yes    Alcohol/week: 3.6 - 7.2 oz    Types: 6 - 12 Standard drinks or equivalent per week  . Drug use: No    Comment: used "pot" 25 years ago.  Marland Kitchen. Sexual activity: Not on file  Lifestyle  . Physical activity:    Days per week: Not on file    Minutes per session: Not on file  . Stress: Not on file  Relationships  . Social connections:    Talks on phone: Not on file    Gets together: Not on file    Attends religious service: Not on file    Active member of club or organization: Not on file    Attends meetings of clubs or organizations: Not on file    Relationship status: Not on file  . Intimate partner violence:    Fear of current or ex partner: Not on file    Emotionally abused: Not on file    Physically abused: Not on file    Forced sexual activity: Not on file  Other Topics Concern  . Not on file  Social History Narrative    . Not on file      Review of Systems  All other systems reviewed and are negative.      Objective:   Physical Exam  Cardiovascular: Normal rate, regular rhythm and normal heart sounds.  Pulmonary/Chest: Effort normal and breath sounds normal. No respiratory distress. He has no wheezes. He has no rales.  Neurological: He has normal reflexes. He exhibits normal muscle tone. Coordination normal.  Vitals reviewed.         Assessment & Plan:  Primary insomnia  I explained to the patient I would not resume Xanax with his oxycodone due to the risk of respiratory depression however we will try amitriptyline 50 mg p.o. nightly for insomnia.  He can take 1/2-1 whole tablet each night as needed for trouble sleeping.  Monitor for dry mouth and constipation.

## 2017-11-26 ENCOUNTER — Telehealth: Payer: Self-pay | Admitting: Family Medicine

## 2017-11-26 ENCOUNTER — Other Ambulatory Visit: Payer: Self-pay | Admitting: Family Medicine

## 2017-11-26 NOTE — Telephone Encounter (Signed)
Patient requesting a refill on Oxycodone     LOV: 11/01/17  LRF:   10/25/17

## 2017-11-26 NOTE — Telephone Encounter (Signed)
Ok to refill??  Last office visit 11/01/2017.  Last refill 10/25/2017.

## 2017-11-26 NOTE — Telephone Encounter (Signed)
Refill on oxycodone to [place] [facility] °

## 2017-12-09 ENCOUNTER — Other Ambulatory Visit: Payer: Self-pay | Admitting: Family Medicine

## 2017-12-09 DIAGNOSIS — M5416 Radiculopathy, lumbar region: Secondary | ICD-10-CM | POA: Diagnosis not present

## 2017-12-09 DIAGNOSIS — M9901 Segmental and somatic dysfunction of cervical region: Secondary | ICD-10-CM | POA: Diagnosis not present

## 2017-12-09 DIAGNOSIS — M9903 Segmental and somatic dysfunction of lumbar region: Secondary | ICD-10-CM | POA: Diagnosis not present

## 2017-12-09 DIAGNOSIS — M5033 Other cervical disc degeneration, cervicothoracic region: Secondary | ICD-10-CM | POA: Diagnosis not present

## 2017-12-24 ENCOUNTER — Other Ambulatory Visit: Payer: Self-pay | Admitting: Family Medicine

## 2017-12-24 NOTE — Telephone Encounter (Signed)
Ok to refill??  Last office visit 11/01/2017.  Last refill 11/26/2017.

## 2018-01-06 ENCOUNTER — Other Ambulatory Visit: Payer: Self-pay | Admitting: Family Medicine

## 2018-01-06 NOTE — Telephone Encounter (Signed)
Ok to refill 

## 2018-01-07 ENCOUNTER — Ambulatory Visit (INDEPENDENT_AMBULATORY_CARE_PROVIDER_SITE_OTHER): Payer: Medicare Other | Admitting: Family Medicine

## 2018-01-07 VITALS — BP 160/100 | HR 98 | Temp 98.2°F | Resp 16 | Ht 68.0 in | Wt 180.0 lb

## 2018-01-07 DIAGNOSIS — I10 Essential (primary) hypertension: Secondary | ICD-10-CM | POA: Diagnosis not present

## 2018-01-07 DIAGNOSIS — E7800 Pure hypercholesterolemia, unspecified: Secondary | ICD-10-CM

## 2018-01-07 DIAGNOSIS — M5030 Other cervical disc degeneration, unspecified cervical region: Secondary | ICD-10-CM

## 2018-01-07 DIAGNOSIS — R300 Dysuria: Secondary | ICD-10-CM | POA: Diagnosis not present

## 2018-01-07 DIAGNOSIS — M5412 Radiculopathy, cervical region: Secondary | ICD-10-CM | POA: Diagnosis not present

## 2018-01-07 LAB — URINALYSIS, ROUTINE W REFLEX MICROSCOPIC
Bilirubin Urine: NEGATIVE
Glucose, UA: NEGATIVE
KETONES UR: NEGATIVE
Leukocytes, UA: NEGATIVE
NITRITE: NEGATIVE
SPECIFIC GRAVITY, URINE: 1.026 (ref 1.001–1.03)
pH: 5.5 (ref 5.0–8.0)

## 2018-01-07 LAB — MICROSCOPIC MESSAGE

## 2018-01-07 MED ORDER — CIPROFLOXACIN HCL 500 MG PO TABS: 500.0000 mg | ORAL_TABLET | Freq: Two times a day (BID) | ORAL | 0 refills | Status: DC

## 2018-01-07 NOTE — Addendum Note (Signed)
Addended by: Legrand RamsWILLIS, SANDY B on: 01/07/2018 08:53 AM   Modules accepted: Orders

## 2018-01-07 NOTE — Progress Notes (Signed)
Subjective:    Patient ID: George Mclaughlin, male    DOB: 07-23-1960, 57 y.o.   MRN: [id number]  Hyperlipidemia   Medication Refill     Patient has a history of cervical radiculopathy.  He has severe left neuroforaminal narrowing at C6-C7 causing left-sided nerve impingement with left-sided neck pain radiating into his left shoulder.  To treat this he uses a combination of meloxicam on a daily basis, oxycodone for breakthrough pain, and Flexeril for muscle spasms.  His last drug screen was within normal limits in March of this year.  He is here today mainly for dysuria.  He states that over the last week, he has developed burning with urination.  The pain radiates into his rectum when he urinates.  Urinalysis today shows +1 blood but no leukocyte esterase and no nitrites.  Prostate exam was performed.  Prostate is mildly swollen but nontender to palpation.  Blood pressure today is extremely high however the patient has not been taking his hydrochlorothiazide at all and also missing his losartan over the last 3 to 4 days.  He is overdue to check his cholesterol. Past Medical History:  Diagnosis Date  . Anxiety   . DDD (degenerative disc disease), cervical    left>right foraminal narrowing at C6-7  . Hyperlipidemia   . Smoker    Past Surgical History:  Procedure Laterality Date  . BACK SURGERY  1999 & 2000   laminectomy and discectomy L4, L5, S1 (Dr. Sen[facility] at [facility])  . NM MYOCAR PERF WALL MOTION  05/26/2010   Normal  . SPINE SURGERY    . US ECHOCARDIOGRAPHY  05/26/2010   mild MR   Current Outpatient Medications on File Prior to Visit  Medication Sig Dispense Refill  . amitriptyline (ELAVIL) 50 MG tablet Take 1 tablet (50 mg total) by mouth at bedtime as needed for sleep. 30 tablet 3  . cyclobenzaprine (FLEXERIL) 10 MG tablet TAKE 1 TABLET BY MOUTH 3 TIMES A DAY AS NEEDED 30 tablet 0  . esomeprazole (NEXIUM) 40 MG capsule TAKE 1 CAPSULE BY MOUTH ONCE DAILY 30 capsule 11  . losartan  (COZAAR) 50 MG tablet Take 1 tablet (50 mg total) by mouth daily. 90 tablet 3  . meloxicam (MOBIC) 7.5 MG tablet TAKE 1 TABLET BY MOUTH DAILY AS NEEDED 30 tablet 3  . Olopatadine HCl 0.2 % SOLN PLACE 1 DROP INTO THE AFFECTED EYE(S) ONCE DAILY 2.5 mL 3  . Oxycodone HCl 10 MG TABS TAKE 1 TABLET BY MOUTH 3 TIMES A DAY AS NEEDED 90 each 0  . rosuvastatin (CRESTOR) 40 MG tablet Take 1 tablet (40 mg total) by mouth daily. 90 tablet 3  . VASCEPA 1 g CAPS TAKE 2 CAPSULES BY MOUTH TWICE DAILY 120 capsule 11   No current facility-administered medications on file prior to visit.    No Known Allergies Social History   Socioeconomic History  . Marital status: Divorced    Spouse name: Not on file  . Number of children: Not on file  . Years of education: Not on file  . Highest education level: Not on file  Occupational History  . Not on file  Social Needs  . Financial resource strain: Not on file  . Food insecurity:    Worry: Not on file    Inability: Not on file  . Transportation needs:    Medical: Not on file    Non-medical: Not on file  Tobacco Use  . Smoking status: Current Some  Day Smoker    Types: Cigarettes  . Smokeless tobacco: Never Used  . Tobacco comment: smoke about 3 cigarettes daily  Substance and Sexual Activity  . Alcohol use: Yes    Alcohol/week: 6.0 - 12.0 standard drinks    Types: 6 - 12 Standard drinks or equivalent per week  . Drug use: No    Comment: used "pot" 25 years ago.  Marland Kitchen Sexual activity: Not on file  Lifestyle  . Physical activity:    Days per week: Not on file    Minutes per session: Not on file  . Stress: Not on file  Relationships  . Social connections:    Talks on phone: Not on file    Gets together: Not on file    Attends religious service: Not on file    Active member of club or organization: Not on file    Attends meetings of clubs or organizations: Not on file    Relationship status: Not on file  . Intimate partner violence:    Fear of  current or ex partner: Not on file    Emotionally abused: Not on file    Physically abused: Not on file    Forced sexual activity: Not on file  Other Topics Concern  . Not on file  Social History Narrative  . Not on file      Review of Systems  All other systems reviewed and are negative.      Objective:   Physical Exam  Cardiovascular: Normal rate, regular rhythm and normal heart sounds.  Pulmonary/Chest: Effort normal and breath sounds normal. No respiratory distress. He has no wheezes. He has no rales.  Abdominal: Soft. Bowel sounds are normal. He exhibits no distension. There is no tenderness. There is no rebound.  Genitourinary: Prostate normal.  Musculoskeletal:       Cervical back: He exhibits decreased range of motion, tenderness and pain.       Back:  Neurological: He has normal reflexes. He exhibits normal muscle tone. Coordination normal.  Vitals reviewed.         Assessment & Plan:  Burning with urination - Plan: Urinalysis, Routine w reflex microscopic, C. trachomatis/N. gonorrhoeae RNA, PSA  DDD (degenerative disc disease), cervical - Plan: Drugs of abuse screen w/o alc, rtn urine-sln  Cervical radiculopathy - Plan: Drugs of abuse screen w/o alc, rtn urine-sln  Benign essential HTN - Plan: COMPLETE METABOLIC PANEL WITH GFR, Lipid panel  Pure hypercholesterolemia  Regarding his blood pressure I want him to resume the losartan 50 mg a day and recheck blood pressure in 2 weeks here to ensure that it is improving.  Regarding his dysuria differential diagnosis includes prostatitis versus GC chlamydia.  I will send urine for GC chlamydia.  Of asked the patient return fasting for CMP and a fasting lipid panel at that time and also check a PSA.  Meanwhile I will the patient empirically for possible prostatitis with Cipro 500 mg p.o. twice daily for 10 days.  There is insufficient urine to check a urine drug screen, urine culture, and send urine for GC chlamydia.   Therefore based on his symptoms I will cancel the urine drug screen and send a urine culture and check for GC chlamydia.  If symptoms do not improve, consider possible nephrolithiasis although he denies any radiating back pain to his Peterson Ao and I suspect infection.  Continue his oxycodone and meloxicam as currently prescribed for his neck pain.  Return fasting for a fasting lipid panel  to monitor his hypercholesterolemia.

## 2018-01-08 LAB — URINE CULTURE
MICRO NUMBER:: 91132215
Result:: NO GROWTH
SPECIMEN QUALITY:: ADEQUATE

## 2018-01-08 LAB — C. TRACHOMATIS/N. GONORRHOEAE RNA
C. TRACHOMATIS RNA, TMA: NOT DETECTED
N. gonorrhoeae RNA, TMA: NOT DETECTED

## 2018-01-10 ENCOUNTER — Other Ambulatory Visit: Payer: Medicare Other

## 2018-01-10 DIAGNOSIS — Z125 Encounter for screening for malignant neoplasm of prostate: Secondary | ICD-10-CM | POA: Diagnosis not present

## 2018-01-10 DIAGNOSIS — I10 Essential (primary) hypertension: Secondary | ICD-10-CM | POA: Diagnosis not present

## 2018-01-10 DIAGNOSIS — R300 Dysuria: Secondary | ICD-10-CM | POA: Diagnosis not present

## 2018-01-11 LAB — PSA: PSA: 0.7 ng/mL (ref <=4.0)

## 2018-01-11 LAB — COMPLETE METABOLIC PANEL WITH GFR
AG Ratio: 1.9 (calc) (ref 1.0–2.5)
ALT: 27 U/L (ref 9–46)
AST: 29 U/L (ref 10–35)
Albumin: 4.1 g/dL (ref 3.6–5.1)
Alkaline phosphatase (APISO): 50 U/L (ref 40–115)
BUN: 13 mg/dL (ref 7–25)
CALCIUM: 9.7 mg/dL (ref 8.6–10.3)
CHLORIDE: 105 mmol/L (ref 98–110)
CO2: 28 mmol/L (ref 20–32)
CREATININE: 0.93 mg/dL (ref 0.70–1.33)
GFR, EST AFRICAN AMERICAN: 107 mL/min/{1.73_m2} (ref >=60)
GFR, EST NON AFRICAN AMERICAN: 92 mL/min/{1.73_m2} (ref >=60)
GLUCOSE: 92 mg/dL (ref 65–99)
Globulin: 2.2 g/dL (calc) (ref 1.9–3.7)
Potassium: 4.3 mmol/L (ref 3.5–5.3)
Sodium: 140 mmol/L (ref 135–146)
TOTAL PROTEIN: 6.3 g/dL (ref 6.1–8.1)
Total Bilirubin: 0.5 mg/dL (ref 0.2–1.2)

## 2018-01-11 LAB — LIPID PANEL
Cholesterol: 125 mg/dL (ref <200)
HDL: 46 mg/dL (ref >40)
LDL CHOLESTEROL (CALC): 65 mg/dL
NON-HDL CHOLESTEROL (CALC): 79 mg/dL (ref <130)
TRIGLYCERIDES: 62 mg/dL (ref <150)
Total CHOL/HDL Ratio: 2.7 (calc) (ref <5.0)

## 2018-01-17 ENCOUNTER — Telehealth: Payer: Self-pay | Admitting: Family Medicine

## 2018-01-17 NOTE — Telephone Encounter (Signed)
Patient left vm asking Sandy to return his call.  I called patient back to get further information. He states that Dr. Tanya Nones ran test at his last office visit and the test came back normal. He states he is still urinating frequently and not a lot comes out. He wanted to know if Dr. Tanya Nones wanted to send in medication for possible Kidney Stone.  CB# [phone]

## 2018-01-18 ENCOUNTER — Other Ambulatory Visit: Payer: Self-pay | Admitting: Family Medicine

## 2018-01-18 NOTE — Telephone Encounter (Signed)
Ok to refill??  Last office visit 01/07/2018.  Last refill 11/26/2017.

## 2018-01-18 NOTE — Telephone Encounter (Signed)
Could check ct renal stone protocol to check hematuria.

## 2018-01-20 ENCOUNTER — Other Ambulatory Visit: Payer: Self-pay | Admitting: Cardiovascular Disease

## 2018-01-25 NOTE — Telephone Encounter (Signed)
Have tried to call several times even speaking to his mother Harriett Sine with no return call.

## 2018-01-26 NOTE — Telephone Encounter (Signed)
Pt called back and states that he is better no need for scan

## 2018-02-03 DIAGNOSIS — M5416 Radiculopathy, lumbar region: Secondary | ICD-10-CM | POA: Diagnosis not present

## 2018-02-03 DIAGNOSIS — M9903 Segmental and somatic dysfunction of lumbar region: Secondary | ICD-10-CM | POA: Diagnosis not present

## 2018-02-03 DIAGNOSIS — M9901 Segmental and somatic dysfunction of cervical region: Secondary | ICD-10-CM | POA: Diagnosis not present

## 2018-02-03 DIAGNOSIS — M5033 Other cervical disc degeneration, cervicothoracic region: Secondary | ICD-10-CM | POA: Diagnosis not present

## 2018-02-15 ENCOUNTER — Other Ambulatory Visit: Payer: Self-pay | Admitting: Family Medicine

## 2018-02-15 NOTE — Telephone Encounter (Signed)
Last OV 01/07/2018 Last refill 01/18/2018 Ok to refill?

## 2018-02-22 ENCOUNTER — Other Ambulatory Visit: Payer: Self-pay | Admitting: Family Medicine

## 2018-02-22 NOTE — Telephone Encounter (Signed)
Ok to refill 

## 2018-03-01 DIAGNOSIS — M9903 Segmental and somatic dysfunction of lumbar region: Secondary | ICD-10-CM | POA: Diagnosis not present

## 2018-03-01 DIAGNOSIS — M5416 Radiculopathy, lumbar region: Secondary | ICD-10-CM | POA: Diagnosis not present

## 2018-03-01 DIAGNOSIS — M5033 Other cervical disc degeneration, cervicothoracic region: Secondary | ICD-10-CM | POA: Diagnosis not present

## 2018-03-01 DIAGNOSIS — M9901 Segmental and somatic dysfunction of cervical region: Secondary | ICD-10-CM | POA: Diagnosis not present

## 2018-03-11 ENCOUNTER — Other Ambulatory Visit: Payer: Self-pay | Admitting: Family Medicine

## 2018-03-11 NOTE — Telephone Encounter (Signed)
Patient requesting a refill on Oxycodone     LOV: 01/07/18  LRF:  02/15/18  - I fixed the date for refill on 03/17/18

## 2018-04-04 DIAGNOSIS — M5416 Radiculopathy, lumbar region: Secondary | ICD-10-CM | POA: Diagnosis not present

## 2018-04-04 DIAGNOSIS — M9901 Segmental and somatic dysfunction of cervical region: Secondary | ICD-10-CM | POA: Diagnosis not present

## 2018-04-04 DIAGNOSIS — M9903 Segmental and somatic dysfunction of lumbar region: Secondary | ICD-10-CM | POA: Diagnosis not present

## 2018-04-04 DIAGNOSIS — M5033 Other cervical disc degeneration, cervicothoracic region: Secondary | ICD-10-CM | POA: Diagnosis not present

## 2018-04-08 ENCOUNTER — Other Ambulatory Visit: Payer: Self-pay | Admitting: Family Medicine

## 2018-04-08 NOTE — Telephone Encounter (Signed)
Ok to refill??  Last office visit 01/07/2018.  Last refill 03/11/2018.

## 2018-04-15 ENCOUNTER — Other Ambulatory Visit: Payer: Self-pay | Admitting: Family Medicine

## 2018-05-04 ENCOUNTER — Other Ambulatory Visit: Payer: Self-pay | Admitting: Family Medicine

## 2018-05-09 ENCOUNTER — Other Ambulatory Visit: Payer: Self-pay | Admitting: Family Medicine

## 2018-05-09 ENCOUNTER — Telehealth: Payer: Self-pay | Admitting: Family Medicine

## 2018-05-09 NOTE — Telephone Encounter (Signed)
Ok to refill??  Last office visit 01/07/2018.  Last refill 04/08/2018.

## 2018-05-09 NOTE — Telephone Encounter (Signed)
Refill on oxycodone to [facility] pharmacy

## 2018-05-10 NOTE — Telephone Encounter (Signed)
This was refilled 05/09/18

## 2018-06-08 ENCOUNTER — Telehealth: Payer: Self-pay | Admitting: Family Medicine

## 2018-06-08 NOTE — Telephone Encounter (Signed)
Patient requesting a refill on Oxycodone     LOV: 01/07/18  LRF:  05/09/18

## 2018-06-08 NOTE — Telephone Encounter (Signed)
Pt needs refill on oxycodone to [place] [facility]

## 2018-06-09 MED ORDER — OXYCODONE HCL 10 MG PO TABS: 10.0000 mg | ORAL_TABLET | Freq: Three times a day (TID) | ORAL | 0 refills | Status: DC | PRN

## 2018-06-10 ENCOUNTER — Ambulatory Visit (INDEPENDENT_AMBULATORY_CARE_PROVIDER_SITE_OTHER): Payer: Medicare Other | Admitting: Family Medicine

## 2018-06-10 ENCOUNTER — Encounter: Payer: Self-pay | Admitting: Family Medicine

## 2018-06-10 VITALS — BP 120/68 | HR 88 | Temp 98.0°F | Resp 18 | Ht 69.0 in | Wt 180.0 lb

## 2018-06-10 DIAGNOSIS — E7800 Pure hypercholesterolemia, unspecified: Secondary | ICD-10-CM | POA: Diagnosis not present

## 2018-06-10 DIAGNOSIS — M5030 Other cervical disc degeneration, unspecified cervical region: Secondary | ICD-10-CM | POA: Diagnosis not present

## 2018-06-10 DIAGNOSIS — M5412 Radiculopathy, cervical region: Secondary | ICD-10-CM | POA: Diagnosis not present

## 2018-06-10 DIAGNOSIS — I10 Essential (primary) hypertension: Secondary | ICD-10-CM | POA: Diagnosis not present

## 2018-06-10 DIAGNOSIS — F411 Generalized anxiety disorder: Secondary | ICD-10-CM

## 2018-06-10 DIAGNOSIS — Z125 Encounter for screening for malignant neoplasm of prostate: Secondary | ICD-10-CM

## 2018-06-10 MED ORDER — DOXAZOSIN MESYLATE 4 MG PO TABS: 4.0000 mg | ORAL_TABLET | Freq: Every day | ORAL | 3 refills | Status: DC

## 2018-06-10 NOTE — Progress Notes (Signed)
Subjective:    Patient ID: George Mclaughlin, male    DOB: 1961-01-01, 58 y.o.   MRN: [id number]  Hyperlipidemia   Medication Refill    Patient has hyperlipidemia and hypertriglyceridemia.  He is currently on a combination of Crestor, fenofibrate, and Vascepa.  He denies any myalgias or right upper quadrant pain.  He is due today for a fasting lipid panel as well as a CMP to monitor his liver function test.  Unfortunately he is still smoking and has no desire to quit at the present time.  He denies any chest pain shortness of breath or dyspnea on exertion.  He is sleeping better although he is not taking the amitriptyline that I gave him at his last visit.  He thinks he is just sleeping better on his own.  In September I checked his prostate his PSA was 0.7.  However he continues to report 3-4 episodes of nocturia every night.  He reports sudden urges however he reports a very weak and dribbling stream.  He tried Flomax in the past however he did not like the way the medication made him feel.  He is also taking hydrochlorothiazide for his blood pressure.  I question if he may do better on doxazosin to help his blood pressure and also help with lower urinary tract symptoms of BPH.  Patient has degenerative disc disease in the cervical spine with cervical radiculopathy.  He takes oxycodone for this.  He is perfectly willing to perform a urine drug screen to ensure compliance.  There is no evidence of abuse or diversion.  He also occasionally uses meloxicam and Flexeril for pain in his neck and in his upper back. Past Medical History:  Diagnosis Date  . Anxiety   . DDD (degenerative disc disease), cervical    left>right foraminal narrowing at C6-7  . Hyperlipidemia   . Smoker    Past Surgical History:  Procedure Laterality Date  . BACK SURGERY  1999 & 2000   laminectomy and discectomy L4, L5, S1 (Dr. Senaida Ores at [facility])  . NM MYOCAR PERF WALL MOTION  05/26/2010   Normal  . SPINE SURGERY    . US  ECHOCARDIOGRAPHY  05/26/2010   mild MR   Current Outpatient Medications on File Prior to Visit  Medication Sig Dispense Refill  . amitriptyline (ELAVIL) 50 MG tablet Take 1 tablet (50 mg total) by mouth at bedtime as needed for sleep. 30 tablet 3  . cyclobenzaprine (FLEXERIL) 10 MG tablet TAKE 1 TABLET BY MOUTH 3 TIMES A DAY AS NEEDED 30 tablet 0  . cyclobenzaprine (FLEXERIL) 10 MG tablet TAKE 1 TABLET BY MOUTH 3 TIMES A DAY AS NEEDED 30 tablet 0  . esomeprazole (NEXIUM) 40 MG capsule TAKE 1 CAPSULE BY MOUTH ONCE DAILY 30 capsule 11  . fenofibrate (TRICOR) 145 MG tablet TAKE 1 TABLET BY MOUTH ONCE A DAY 90 tablet 3  . losartan (COZAAR) 50 MG tablet TAKE 1 TABLET BY MOUTH ONCE DAILY 90 tablet 3  . meloxicam (MOBIC) 7.5 MG tablet TAKE 1 TABLET BY MOUTH DAILY AS NEEDED 30 tablet 3  . Olopatadine HCl 0.2 % SOLN PLACE 1 DROP INTO THE AFFECTED EYE(S) ONCE DAILY 2.5 mL 3  . Oxycodone HCl 10 MG TABS Take 1 tablet (10 mg total) by mouth 3 (three) times daily as needed. 90 each 0  . rosuvastatin (CRESTOR) 40 MG tablet TAKE 1 TABLET BY MOUTH ONCE DAILY 90 tablet 3  . VASCEPA 1 g CAPS TAKE  2 CAPSULES BY MOUTH TWICE DAILY 120 capsule 3   No current facility-administered medications on file prior to visit.    No Known Allergies Social History   Socioeconomic History  . Marital status: Divorced    Spouse name: Not on file  . Number of children: Not on file  . Years of education: Not on file  . Highest education level: Not on file  Occupational History  . Not on file  Social Needs  . Financial resource strain: Not on file  . Food insecurity:    Worry: Not on file    Inability: Not on file  . Transportation needs:    Medical: Not on file    Non-medical: Not on file  Tobacco Use  . Smoking status: Current Some Day Smoker    Types: Cigarettes  . Smokeless tobacco: Never Used  . Tobacco comment: smoke about 3 cigarettes daily  Substance and Sexual Activity  . Alcohol use: Yes    Alcohol/week:  6.0 - 12.0 standard drinks    Types: 6 - 12 Standard drinks or equivalent per week  . Drug use: No    Comment: used "pot" 25 years ago.  Marland Kitchen Sexual activity: Not on file  Lifestyle  . Physical activity:    Days per week: Not on file    Minutes per session: Not on file  . Stress: Not on file  Relationships  . Social connections:    Talks on phone: Not on file    Gets together: Not on file    Attends religious service: Not on file    Active member of club or organization: Not on file    Attends meetings of clubs or organizations: Not on file    Relationship status: Not on file  . Intimate partner violence:    Fear of current or ex partner: Not on file    Emotionally abused: Not on file    Physically abused: Not on file    Forced sexual activity: Not on file  Other Topics Concern  . Not on file  Social History Narrative  . Not on file      Review of Systems  All other systems reviewed and are negative.      Objective:   Physical Exam  Constitutional: He appears well-developed and well-nourished. No distress.  Neck: Neck supple. No JVD present. No thyromegaly present.  Cardiovascular: Normal rate, regular rhythm and normal heart sounds. Exam reveals no gallop and no friction rub.  No murmur heard. Pulmonary/Chest: Effort normal and breath sounds normal. No respiratory distress. He has no wheezes. He has no rales.  Abdominal: Soft. Bowel sounds are normal. He exhibits no distension and no mass. There is no abdominal tenderness. There is no rebound and no guarding.  Musculoskeletal:        General: No edema.  Lymphadenopathy:    He has no cervical adenopathy.  Neurological: He has normal reflexes. He exhibits normal muscle tone. Coordination normal.  Skin: He is not diaphoretic.  Vitals reviewed.         Assessment & Plan:  DDD (degenerative disc disease), cervical - Plan: Drugs of abuse screen w/o alc, rtn urine-sln  Cervical radiculopathy - Plan: Drugs of abuse  screen w/o alc, rtn urine-sln  Benign essential HTN - Plan: CBC with Differential/Platelet, COMPLETE METABOLIC PANEL WITH GFR, Lipid panel  Pure hypercholesterolemia - Plan: CBC with Differential/Platelet, COMPLETE METABOLIC PANEL WITH GFR, Lipid panel  GAD (generalized anxiety disorder)  Prostate cancer screening -  Plan: CANCELED: PSA  Blood pressure is excellent.  However we have decided to stop hydrochlorothiazide and replace with doxazosin 4 mg a day to see if we can help with the lower urinary tract symptoms while also managing his blood pressure.  Possibly stopping the diuretic may help as well.  Patient continues to use oxycodone for cervical radiculopathy due to his cervical degenerative disc disease.  He willingly consents to a urine drug screen.  I have no concern about abuse or diversion.  Unfortunately he continues to smoke and I have encouraged smoking cessation.  For his hypercholesterolemia I will check a CMP and a fasting lipid panel.  He is compliant with his Crestor, fenofibrate, and the Cipro.  He denies any right upper quadrant pain however will need to monitor his liver function test closely.  He also denies any myalgias and there is no evidence of rhabdomyolysis on exam today.  I recommended a flu shot but he politely declined.  His last colonoscopy was 5 years ago according to the patient and therefore is up-to-date

## 2018-06-11 LAB — COMPLETE METABOLIC PANEL WITH GFR
AG Ratio: 2 (calc) (ref 1.0–2.5)
ALT: 21 U/L (ref 9–46)
AST: 25 U/L (ref 10–35)
Albumin: 4.9 g/dL (ref 3.6–5.1)
Alkaline phosphatase (APISO): 58 U/L (ref 35–144)
BUN: 13 mg/dL (ref 7–25)
CALCIUM: 10.1 mg/dL (ref 8.6–10.3)
CO2: 26 mmol/L (ref 20–32)
Chloride: 101 mmol/L (ref 98–110)
Creat: 0.93 mg/dL (ref 0.70–1.33)
GFR, EST NON AFRICAN AMERICAN: 92 mL/min/{1.73_m2} (ref >=60)
GFR, Est African American: 107 mL/min/{1.73_m2} (ref >=60)
Globulin: 2.4 g/dL (calc) (ref 1.9–3.7)
Glucose, Bld: 97 mg/dL (ref 65–99)
Potassium: 4 mmol/L (ref 3.5–5.3)
Sodium: 139 mmol/L (ref 135–146)
Total Bilirubin: 0.7 mg/dL (ref 0.2–1.2)
Total Protein: 7.3 g/dL (ref 6.1–8.1)

## 2018-06-11 LAB — LIPID PANEL
Cholesterol: 204 mg/dL — ABNORMAL HIGH (ref <200)
HDL: 50 mg/dL (ref >=40)
LDL Cholesterol (Calc): 135 mg/dL (calc) — ABNORMAL HIGH
NON-HDL CHOLESTEROL (CALC): 154 mg/dL — AB (ref <130)
Total CHOL/HDL Ratio: 4.1 (calc) (ref <5.0)
Triglycerides: 87 mg/dL (ref <150)

## 2018-06-11 LAB — CBC WITH DIFFERENTIAL/PLATELET
Absolute Monocytes: 554 cells/uL (ref 200–950)
Basophils Absolute: 32 cells/uL (ref 0–200)
Basophils Relative: 0.5 %
Eosinophils Absolute: 170 cells/uL (ref 15–500)
Eosinophils Relative: 2.7 %
HCT: 44.4 % (ref 38.5–50.0)
Hemoglobin: 15.8 g/dL (ref 13.2–17.1)
LYMPHS ABS: 2186 {cells}/uL (ref 850–3900)
MCH: 32 pg (ref 27.0–33.0)
MCHC: 35.6 g/dL (ref 32.0–36.0)
MCV: 90.1 fL (ref 80.0–100.0)
MPV: 10.1 fL (ref 7.5–12.5)
Monocytes Relative: 8.8 %
Neutro Abs: 3358 cells/uL (ref 1500–7800)
Neutrophils Relative %: 53.3 %
Platelets: 206 10*3/uL (ref 140–400)
RBC: 4.93 10*6/uL (ref 4.20–5.80)
RDW: 12.5 % (ref 11.0–15.0)
Total Lymphocyte: 34.7 %
WBC: 6.3 10*3/uL (ref 3.8–10.8)

## 2018-06-14 LAB — DRUGS OF ABUSE SCREEN W/O ALC, ROUTINE URINE
AMPHETAMINES (1000 NG/ML SCRN): NEGATIVE
BARBITURATES: NEGATIVE
BENZODIAZEPINES: NEGATIVE
COCAINE METABOLITES: NEGATIVE
MARIJUANA MET (50 ng/mL SCRN): NEGATIVE
METHADONE: NEGATIVE
METHAQUALONE: NEGATIVE
OPIATES: NEGATIVE
PHENCYCLIDINE: NEGATIVE
PROPOXYPHENE: NEGATIVE

## 2018-06-17 ENCOUNTER — Encounter: Payer: Self-pay | Admitting: Family Medicine

## 2018-06-17 ENCOUNTER — Ambulatory Visit (INDEPENDENT_AMBULATORY_CARE_PROVIDER_SITE_OTHER): Payer: Medicare Other | Admitting: Family Medicine

## 2018-06-17 VITALS — BP 120/68 | HR 108 | Temp 98.1°F | Resp 16 | Ht 69.0 in | Wt 182.0 lb

## 2018-06-17 DIAGNOSIS — M5412 Radiculopathy, cervical region: Secondary | ICD-10-CM

## 2018-06-17 DIAGNOSIS — M5030 Other cervical disc degeneration, unspecified cervical region: Secondary | ICD-10-CM | POA: Diagnosis not present

## 2018-06-17 NOTE — Progress Notes (Signed)
Subjective:    Patient ID: George Mclaughlin, male    DOB: [birth year], 58 y.o.   MRN: [id number]  06/10/18 Patient has hyperlipidemia and hypertriglyceridemia.  He is currently on a combination of Crestor, fenofibrate, and Vascepa.  He denies any myalgias or right upper quadrant pain.  He is due today for a fasting lipid panel as well as a CMP to monitor his liver function test.  Unfortunately he is still smoking and has no desire to quit at the present time.  He denies any chest pain shortness of breath or dyspnea on exertion.  He is sleeping better although he is not taking the amitriptyline that I gave him at his last visit.  He thinks he is just sleeping better on his own.  In September I checked his prostate his PSA was 0.7.  However he continues to report 3-4 episodes of nocturia every night.  He reports sudden urges however he reports a very weak and dribbling stream.  He tried Flomax in the past however he did not like the way the medication made him feel.  He is also taking hydrochlorothiazide for his blood pressure.  I question if he may do better on doxazosin to help his blood pressure and also help with lower urinary tract symptoms of BPH.  Patient has degenerative disc disease in the cervical spine with cervical radiculopathy.  He takes oxycodone for this.  He is perfectly willing to perform a urine drug screen to ensure compliance.  There is no evidence of abuse or diversion.  He also occasionally uses meloxicam and Flexeril for pain in his neck and in his upper back.  At that time, my plan was: Blood pressure is excellent.  However we have decided to stop hydrochlorothiazide and replace with doxazosin 4 mg a day to see if we can help with the lower urinary tract symptoms while also managing his blood pressure.  Possibly stopping the diuretic may help as well.  Patient continues to use oxycodone for cervical radiculopathy due to his cervical degenerative disc disease.  He willingly consents to a  urine drug screen.  I have no concern about abuse or diversion.  Unfortunately he continues to smoke and I have encouraged smoking cessation.  For his hypercholesterolemia I will check a CMP and a fasting lipid panel.  He is compliant with his Crestor, fenofibrate, and the Cipro.  He denies any right upper quadrant pain however will need to monitor his liver function test closely.  He also denies any myalgias and there is no evidence of rhabdomyolysis on exam today.  I recommended a flu shot but he politely declined.  His last colonoscopy was 5 years ago according to the patient and therefore is up-to-date  06/17/18 UDS was negative for any controlled substances or illicit substances.  However, the patient is supposed to be taking oxycodone TID (90/month).  Patient insists has been taking the medication 3 times a day.  He has chronic neck pain with left-sided cervical radiculopathy.  This is been confirmed on cervical MRIs.  He states that he took the medication the day before the test.  He had not taken the medication the morning of the test.  He also had a difficult time urinating.  Therefore he states that he drank probably a gallon of water and he suspects that his urine sample was diluted.  He is very upset that the test was negative. Past Medical History:  Diagnosis Date  . Anxiety   .  DDD (degenerative disc disease), cervical    left>right foraminal narrowing at C6-7  . Hyperlipidemia   . Smoker    Past Surgical History:  Procedure Laterality Date  . BACK SURGERY  1999 & 2000   laminectomy and discectomy L4, L5, S1 (Dr. Senaida Ores at [facility])  . NM MYOCAR PERF WALL MOTION  05/26/2010   Normal  . SPINE SURGERY    . US ECHOCARDIOGRAPHY  05/26/2010   mild MR   Current Outpatient Medications on File Prior to Visit  Medication Sig Dispense Refill  . amitriptyline (ELAVIL) 50 MG tablet Take 1 tablet (50 mg total) by mouth at bedtime as needed for sleep. (Patient not taking: Reported on 06/10/2018) 30  tablet 3  . cyclobenzaprine (FLEXERIL) 10 MG tablet TAKE 1 TABLET BY MOUTH 3 TIMES A DAY AS NEEDED 30 tablet 0  . doxazosin (CARDURA) 4 MG tablet Take 1 tablet (4 mg total) by mouth daily. 30 tablet 3  . esomeprazole (NEXIUM) 40 MG capsule TAKE 1 CAPSULE BY MOUTH ONCE DAILY 30 capsule 11  . fenofibrate (TRICOR) 145 MG tablet TAKE 1 TABLET BY MOUTH ONCE A DAY 90 tablet 3  . hydrochlorothiazide (HYDRODIURIL) 25 MG tablet     . losartan (COZAAR) 50 MG tablet TAKE 1 TABLET BY MOUTH ONCE DAILY 90 tablet 3  . meloxicam (MOBIC) 7.5 MG tablet TAKE 1 TABLET BY MOUTH DAILY AS NEEDED 30 tablet 3  . Olopatadine HCl 0.2 % SOLN PLACE 1 DROP INTO THE AFFECTED EYE(S) ONCE DAILY 2.5 mL 3  . Oxycodone HCl 10 MG TABS Take 1 tablet (10 mg total) by mouth 3 (three) times daily as needed. 90 each 0  . rosuvastatin (CRESTOR) 40 MG tablet TAKE 1 TABLET BY MOUTH ONCE DAILY 90 tablet 3  . VASCEPA 1 g CAPS TAKE 2 CAPSULES BY MOUTH TWICE DAILY 120 capsule 3   No current facility-administered medications on file prior to visit.    No Known Allergies Social History   Socioeconomic History  . Marital status: Divorced    Spouse name: Not on file  . Number of children: Not on file  . Years of education: Not on file  . Highest education level: Not on file  Occupational History  . Not on file  Social Needs  . Financial resource strain: Not on file  . Food insecurity:    Worry: Not on file    Inability: Not on file  . Transportation needs:    Medical: Not on file    Non-medical: Not on file  Tobacco Use  . Smoking status: Current Some Day Smoker    Types: Cigarettes  . Smokeless tobacco: Never Used  . Tobacco comment: smoke about 3 cigarettes daily  Substance and Sexual Activity  . Alcohol use: Yes    Alcohol/week: 6.0 - 12.0 standard drinks    Types: 6 - 12 Standard drinks or equivalent per week  . Drug use: No    Comment: used "pot" 25 years ago.  Marland Kitchen Sexual activity: Not on file  Lifestyle  . Physical  activity:    Days per week: Not on file    Minutes per session: Not on file  . Stress: Not on file  Relationships  . Social connections:    Talks on phone: Not on file    Gets together: Not on file    Attends religious service: Not on file    Active member of club or organization: Not on file    Attends meetings of  clubs or organizations: Not on file    Relationship status: Not on file  . Intimate partner violence:    Fear of current or ex partner: Not on file    Emotionally abused: Not on file    Physically abused: Not on file    Forced sexual activity: Not on file  Other Topics Concern  . Not on file  Social History Narrative  . Not on file      Review of Systems  All other systems reviewed and are negative.      Objective:   Physical Exam  Constitutional: He appears well-developed and well-nourished. No distress.  Neck: Neck supple.  Cardiovascular: Normal rate, regular rhythm and normal heart sounds. Exam reveals no gallop and no friction rub.  No murmur heard. Pulmonary/Chest: Effort normal and breath sounds normal. No respiratory distress. He has no wheezes. He has no rales.  Skin: He is not diaphoretic.  Vitals reviewed.         Assessment & Plan:  DDD (degenerative disc disease), cervical - Plan: Ambulatory referral to Pain Clinic  Cervical radiculopathy - Plan: Ambulatory referral to Pain Clinic  I explained to the patient that my hands are tied.  His urine drug screen was negative which is inconsistent with the amount of oxycodone he has been prescribed.  Therefore I recommended a referral to a pain clinic.  I do believe the patient has chronic neck pain and left-sided cervical radiculopathy.  This is been confirmed with MRIs.  Therefore I will refer the patient to a pain clinic to better manage his pain.  Patient is comfortable with this plan

## 2018-06-23 DIAGNOSIS — M5416 Radiculopathy, lumbar region: Secondary | ICD-10-CM | POA: Diagnosis not present

## 2018-06-23 DIAGNOSIS — M5033 Other cervical disc degeneration, cervicothoracic region: Secondary | ICD-10-CM | POA: Diagnosis not present

## 2018-06-23 DIAGNOSIS — M9901 Segmental and somatic dysfunction of cervical region: Secondary | ICD-10-CM | POA: Diagnosis not present

## 2018-06-23 DIAGNOSIS — M9903 Segmental and somatic dysfunction of lumbar region: Secondary | ICD-10-CM | POA: Diagnosis not present

## 2018-07-06 DIAGNOSIS — Z79899 Other long term (current) drug therapy: Secondary | ICD-10-CM | POA: Diagnosis not present

## 2018-07-06 DIAGNOSIS — M549 Dorsalgia, unspecified: Secondary | ICD-10-CM | POA: Diagnosis not present

## 2018-07-06 DIAGNOSIS — M542 Cervicalgia: Secondary | ICD-10-CM | POA: Diagnosis not present

## 2018-07-06 DIAGNOSIS — M129 Arthropathy, unspecified: Secondary | ICD-10-CM | POA: Diagnosis not present

## 2018-07-06 DIAGNOSIS — G8929 Other chronic pain: Secondary | ICD-10-CM | POA: Diagnosis not present

## 2018-07-06 DIAGNOSIS — M545 Low back pain: Secondary | ICD-10-CM | POA: Diagnosis not present

## 2018-07-06 DIAGNOSIS — E559 Vitamin D deficiency, unspecified: Secondary | ICD-10-CM | POA: Diagnosis not present

## 2018-07-11 ENCOUNTER — Other Ambulatory Visit: Payer: Self-pay | Admitting: Family Medicine

## 2018-07-11 DIAGNOSIS — M545 Low back pain, unspecified: Secondary | ICD-10-CM

## 2018-07-11 DIAGNOSIS — G8929 Other chronic pain: Secondary | ICD-10-CM

## 2018-07-19 DIAGNOSIS — G8929 Other chronic pain: Secondary | ICD-10-CM | POA: Diagnosis not present

## 2018-07-19 DIAGNOSIS — M545 Low back pain: Secondary | ICD-10-CM | POA: Diagnosis not present

## 2018-07-19 DIAGNOSIS — Z79899 Other long term (current) drug therapy: Secondary | ICD-10-CM | POA: Diagnosis not present

## 2018-07-19 DIAGNOSIS — M542 Cervicalgia: Secondary | ICD-10-CM | POA: Diagnosis not present

## 2018-08-18 DIAGNOSIS — Z79899 Other long term (current) drug therapy: Secondary | ICD-10-CM | POA: Diagnosis not present

## 2018-08-18 DIAGNOSIS — G8929 Other chronic pain: Secondary | ICD-10-CM | POA: Diagnosis not present

## 2018-08-18 DIAGNOSIS — M545 Low back pain: Secondary | ICD-10-CM | POA: Diagnosis not present

## 2018-09-05 ENCOUNTER — Other Ambulatory Visit: Payer: Self-pay | Admitting: Family Medicine

## 2018-09-05 NOTE — Telephone Encounter (Signed)
Ok to refill 

## 2018-09-15 DIAGNOSIS — Z79899 Other long term (current) drug therapy: Secondary | ICD-10-CM | POA: Diagnosis not present

## 2018-09-15 DIAGNOSIS — M545 Low back pain: Secondary | ICD-10-CM | POA: Diagnosis not present

## 2018-09-15 DIAGNOSIS — G8929 Other chronic pain: Secondary | ICD-10-CM | POA: Diagnosis not present

## 2018-09-28 ENCOUNTER — Other Ambulatory Visit: Payer: Self-pay | Admitting: Family Medicine

## 2018-10-11 ENCOUNTER — Other Ambulatory Visit: Payer: Self-pay | Admitting: Family Medicine

## 2018-10-13 DIAGNOSIS — M545 Low back pain: Secondary | ICD-10-CM | POA: Diagnosis not present

## 2018-10-13 DIAGNOSIS — Z79899 Other long term (current) drug therapy: Secondary | ICD-10-CM | POA: Diagnosis not present

## 2018-10-13 DIAGNOSIS — G8929 Other chronic pain: Secondary | ICD-10-CM | POA: Diagnosis not present

## 2018-11-10 DIAGNOSIS — M545 Low back pain: Secondary | ICD-10-CM | POA: Diagnosis not present

## 2018-11-10 DIAGNOSIS — F17210 Nicotine dependence, cigarettes, uncomplicated: Secondary | ICD-10-CM | POA: Diagnosis not present

## 2018-11-10 DIAGNOSIS — Z79899 Other long term (current) drug therapy: Secondary | ICD-10-CM | POA: Diagnosis not present

## 2018-11-10 DIAGNOSIS — Z122 Encounter for screening for malignant neoplasm of respiratory organs: Secondary | ICD-10-CM | POA: Diagnosis not present

## 2018-11-10 DIAGNOSIS — G8929 Other chronic pain: Secondary | ICD-10-CM | POA: Diagnosis not present

## 2018-11-21 DIAGNOSIS — F17210 Nicotine dependence, cigarettes, uncomplicated: Secondary | ICD-10-CM | POA: Diagnosis not present

## 2018-11-21 DIAGNOSIS — Z122 Encounter for screening for malignant neoplasm of respiratory organs: Secondary | ICD-10-CM | POA: Diagnosis not present

## 2018-12-08 DIAGNOSIS — F17210 Nicotine dependence, cigarettes, uncomplicated: Secondary | ICD-10-CM | POA: Diagnosis not present

## 2018-12-08 DIAGNOSIS — G8929 Other chronic pain: Secondary | ICD-10-CM | POA: Diagnosis not present

## 2018-12-08 DIAGNOSIS — M545 Low back pain: Secondary | ICD-10-CM | POA: Diagnosis not present

## 2018-12-08 DIAGNOSIS — Z79899 Other long term (current) drug therapy: Secondary | ICD-10-CM | POA: Diagnosis not present

## 2018-12-21 ENCOUNTER — Other Ambulatory Visit: Payer: Self-pay | Admitting: Family Medicine

## 2018-12-21 NOTE — Telephone Encounter (Signed)
Requesting refill    Flexeril  LOV: 06/17/18  LRF: 09/29/18

## 2019-01-05 DIAGNOSIS — G8929 Other chronic pain: Secondary | ICD-10-CM | POA: Diagnosis not present

## 2019-01-05 DIAGNOSIS — M545 Low back pain: Secondary | ICD-10-CM | POA: Diagnosis not present

## 2019-01-05 DIAGNOSIS — Z79899 Other long term (current) drug therapy: Secondary | ICD-10-CM | POA: Diagnosis not present

## 2019-01-06 ENCOUNTER — Other Ambulatory Visit: Payer: Self-pay | Admitting: Family Medicine

## 2019-01-06 ENCOUNTER — Ambulatory Visit: Payer: Medicare Other | Admitting: Family Medicine

## 2019-01-06 ENCOUNTER — Telehealth: Payer: Self-pay

## 2019-01-06 MED ORDER — TAMSULOSIN HCL 0.4 MG PO CAPS: 0.4000 mg | ORAL_CAPSULE | Freq: Every day | ORAL | 3 refills | Status: DC

## 2019-01-06 NOTE — Telephone Encounter (Signed)
Pt called and stated that he missed his appt today due to his daughter having a flat tire and he is r/s for Monday 09/21. Pt would like to know if you could sent a Rx for Flomax to his pharmacy. Pt states he is having a hard time urinating. Please advise.

## 2019-01-06 NOTE — Telephone Encounter (Signed)
I sent it to his pharmacy.

## 2019-01-07 ENCOUNTER — Other Ambulatory Visit: Payer: Self-pay | Admitting: Family Medicine

## 2019-01-09 ENCOUNTER — Ambulatory Visit: Payer: Medicare Other | Admitting: Family Medicine

## 2019-01-09 ENCOUNTER — Other Ambulatory Visit: Payer: Self-pay

## 2019-01-09 NOTE — Telephone Encounter (Signed)
Ok to refill 

## 2019-01-10 ENCOUNTER — Ambulatory Visit (INDEPENDENT_AMBULATORY_CARE_PROVIDER_SITE_OTHER): Payer: Medicare Other | Admitting: Family Medicine

## 2019-01-10 ENCOUNTER — Encounter: Payer: Self-pay | Admitting: Family Medicine

## 2019-01-10 VITALS — BP 140/80 | HR 82 | Temp 98.2°F | Resp 16 | Ht 69.0 in | Wt 180.0 lb

## 2019-01-10 DIAGNOSIS — R300 Dysuria: Secondary | ICD-10-CM | POA: Diagnosis not present

## 2019-01-10 DIAGNOSIS — R350 Frequency of micturition: Secondary | ICD-10-CM

## 2019-01-10 DIAGNOSIS — I10 Essential (primary) hypertension: Secondary | ICD-10-CM

## 2019-01-10 LAB — URINALYSIS, ROUTINE W REFLEX MICROSCOPIC
Bilirubin Urine: NEGATIVE
Glucose, UA: NEGATIVE
Hgb urine dipstick: NEGATIVE
Ketones, ur: NEGATIVE
Leukocytes,Ua: NEGATIVE
Nitrite: NEGATIVE
Protein, ur: NEGATIVE
Specific Gravity, Urine: 1.017 (ref 1.001–1.03)
pH: 7 (ref 5.0–8.0)

## 2019-01-10 NOTE — Progress Notes (Signed)
Subjective:    Patient ID: George Mclaughlin, male    DOB: 1960/07/04, 58 y.o.   MRN: [id number]  Patient states that he thinks he has a problem with his kidneys.  He reports a pressure-like sensation in his left flank.  He reports polyuria.  He reports the sudden urge to urinate more frequently.  However when that occurs he has a weak stream.  At times he is unable to empty his bladder completely.  He denies any dysuria.  Urinalysis today is completely normal.  There is no blood.  There is no nitrites.  There is no leukocyte esterase.  He denies any pain radiating from his flank into his abdomen.  Patient's primary complaint however is polyuria, frequency, urgency, and hesitancy.  The majority of his symptoms sound like lower urinary tract symptoms. Past Medical History:  Diagnosis Date  . Anxiety   . DDD (degenerative disc disease), cervical    left>right foraminal narrowing at C6-7  . Hyperlipidemia   . Smoker    Past Surgical History:  Procedure Laterality Date  . BACK SURGERY  1999 & 2000   laminectomy and discectomy L4, L5, S1 (Dr. Marvel Plan at [facility])  . NM MYOCAR PERF WALL MOTION  05/26/2010   Normal  . SPINE SURGERY    . US ECHOCARDIOGRAPHY  05/26/2010   mild MR   Current Outpatient Medications on File Prior to Visit  Medication Sig Dispense Refill  . amitriptyline (ELAVIL) 50 MG tablet Take 1 tablet (50 mg total) by mouth at bedtime as needed for sleep. (Patient not taking: Reported on 06/10/2018) 30 tablet 3  . cyclobenzaprine (FLEXERIL) 10 MG tablet TAKE 1 TABLET BY MOUTH 3 TIMES A DAY AS NEEDED 30 tablet 0  . doxazosin (CARDURA) 4 MG tablet Take 1 tablet (4 mg total) by mouth daily. 30 tablet 3  . esomeprazole (NEXIUM) 40 MG capsule TAKE 1 CAPSULE BY MOUTH ONCE DAILY 30 capsule 11  . fenofibrate (TRICOR) 145 MG tablet TAKE 1 TABLET BY MOUTH ONCE A DAY 90 tablet 3  . hydrochlorothiazide (HYDRODIURIL) 25 MG tablet TAKE 1 TABLET BY MOUTH ONCE DAILY 90 tablet 1  . losartan  (COZAAR) 50 MG tablet TAKE 1 TABLET BY MOUTH ONCE DAILY 90 tablet 3  . meloxicam (MOBIC) 7.5 MG tablet TAKE 1 TABLET BY MOUTH ONCE DAILY AS NEEDED 30 tablet 3  . Olopatadine HCl 0.2 % SOLN PLACE 1 DROP INTO THE AFFECTED EYE(S) ONCE DAILY 2.5 mL 3  . Oxycodone HCl 10 MG TABS Take 1 tablet (10 mg total) by mouth 3 (three) times daily as needed. 90 each 0  . rosuvastatin (CRESTOR) 40 MG tablet TAKE 1 TABLET BY MOUTH ONCE DAILY 90 tablet 3  . tamsulosin (FLOMAX) 0.4 MG CAPS capsule Take 1 capsule (0.4 mg total) by mouth daily. 30 capsule 3  . VASCEPA 1 g CAPS TAKE 2 CAPSULES BY MOUTH TWICE DAILY 120 capsule 3   No current facility-administered medications on file prior to visit.    No Known Allergies Social History   Socioeconomic History  . Marital status: Divorced    Spouse name: Not on file  . Number of children: Not on file  . Years of education: Not on file  . Highest education level: Not on file  Occupational History  . Not on file  Social Needs  . Financial resource strain: Not on file  . Food insecurity    Worry: Not on file    Inability: Not on file  .  Transportation needs    Medical: Not on file    Non-medical: Not on file  Tobacco Use  . Smoking status: Current Some Day Smoker    Types: Cigarettes  . Smokeless tobacco: Never Used  . Tobacco comment: smoke about 3 cigarettes daily  Substance and Sexual Activity  . Alcohol use: Yes    Alcohol/week: 6.0 - 12.0 standard drinks    Types: 6 - 12 Standard drinks or equivalent per week  . Drug use: No    Comment: used "pot" 25 years ago.  Marland Kitchen Sexual activity: Not on file  Lifestyle  . Physical activity    Days per week: Not on file    Minutes per session: Not on file  . Stress: Not on file  Relationships  . Social Musician on phone: Not on file    Gets together: Not on file    Attends religious service: Not on file    Active member of club or organization: Not on file    Attends meetings of clubs or  organizations: Not on file    Relationship status: Not on file  . Intimate partner violence    Fear of current or ex partner: Not on file    Emotionally abused: Not on file    Physically abused: Not on file    Forced sexual activity: Not on file  Other Topics Concern  . Not on file  Social History Narrative  . Not on file      Review of Systems  All other systems reviewed and are negative.      Objective:   Physical Exam  Cardiovascular: Normal rate, regular rhythm and normal heart sounds.  Pulmonary/Chest: Effort normal and breath sounds normal. No respiratory distress. He has no wheezes. He has no rales.  Abdominal: Soft. Bowel sounds are normal. He exhibits no distension. There is no abdominal tenderness. There is no rebound.  Genitourinary:    Prostate normal.   Musculoskeletal:     Cervical back: He exhibits decreased range of motion, tenderness and pain.       Back:  Neurological: He has normal reflexes. He exhibits normal muscle tone. Coordination normal.  Vitals reviewed.         Assessment & Plan:  Burning with urination - Plan: Urinalysis, Routine w reflex microscopic  Frequent urination - Plan: Urinalysis, Routine w reflex microscopic  Benign essential HTN - Plan: CBC with Differential/Platelet, COMPLETE METABOLIC PANEL WITH GFR, Lipid panel  I believe his symptoms are lower urinary tract symptoms.  I will start the patient on Flomax 0.4 mg p.o. nightly and see if relaxing his sphincter will help some of his lower urinary tract symptoms.  He will call me back in 2 to 3 weeks and give me an update.  Urinalysis is completely normal today.  The other potential cause would be overactive bladder.  I would have the patient return fasting for CBC, CMP, fasting lipid panel to monitor his cholesterol as well as getting fasting blood sugar given his polyuria.

## 2019-01-14 ENCOUNTER — Other Ambulatory Visit: Payer: Self-pay | Admitting: Family Medicine

## 2019-01-16 NOTE — Telephone Encounter (Signed)
Requesting refill on Olopatadine -  OK to refill these eye drops?

## 2019-02-02 DIAGNOSIS — Z79899 Other long term (current) drug therapy: Secondary | ICD-10-CM | POA: Diagnosis not present

## 2019-02-02 DIAGNOSIS — M545 Low back pain: Secondary | ICD-10-CM | POA: Diagnosis not present

## 2019-02-02 DIAGNOSIS — F17210 Nicotine dependence, cigarettes, uncomplicated: Secondary | ICD-10-CM | POA: Diagnosis not present

## 2019-02-02 DIAGNOSIS — Z7189 Other specified counseling: Secondary | ICD-10-CM | POA: Diagnosis not present

## 2019-02-02 DIAGNOSIS — G8929 Other chronic pain: Secondary | ICD-10-CM | POA: Diagnosis not present

## 2019-02-09 ENCOUNTER — Other Ambulatory Visit: Payer: Self-pay | Admitting: Family Medicine

## 2019-02-09 NOTE — Telephone Encounter (Signed)
Ok to refill 

## 2019-02-15 ENCOUNTER — Other Ambulatory Visit: Payer: Self-pay | Admitting: Family Medicine

## 2019-03-02 DIAGNOSIS — G8929 Other chronic pain: Secondary | ICD-10-CM | POA: Diagnosis not present

## 2019-03-02 DIAGNOSIS — M545 Low back pain: Secondary | ICD-10-CM | POA: Diagnosis not present

## 2019-03-02 DIAGNOSIS — Z79899 Other long term (current) drug therapy: Secondary | ICD-10-CM | POA: Diagnosis not present

## 2019-03-08 ENCOUNTER — Other Ambulatory Visit: Payer: Self-pay | Admitting: Family Medicine

## 2019-03-08 NOTE — Telephone Encounter (Signed)
Ok to refill 

## 2019-03-24 ENCOUNTER — Other Ambulatory Visit: Payer: Self-pay | Admitting: Family Medicine

## 2019-03-24 NOTE — Telephone Encounter (Signed)
Ok to refill 

## 2019-03-30 DIAGNOSIS — Z79899 Other long term (current) drug therapy: Secondary | ICD-10-CM | POA: Diagnosis not present

## 2019-03-30 DIAGNOSIS — G8929 Other chronic pain: Secondary | ICD-10-CM | POA: Diagnosis not present

## 2019-03-30 DIAGNOSIS — M545 Low back pain: Secondary | ICD-10-CM | POA: Diagnosis not present

## 2019-04-17 ENCOUNTER — Other Ambulatory Visit: Payer: Self-pay | Admitting: Family Medicine

## 2019-04-17 NOTE — Telephone Encounter (Signed)
Ok to refill 

## 2019-04-20 ENCOUNTER — Other Ambulatory Visit: Payer: Self-pay | Admitting: Family Medicine

## 2019-04-20 DIAGNOSIS — G8929 Other chronic pain: Secondary | ICD-10-CM

## 2019-04-27 DIAGNOSIS — M545 Low back pain: Secondary | ICD-10-CM | POA: Diagnosis not present

## 2019-04-27 DIAGNOSIS — G8929 Other chronic pain: Secondary | ICD-10-CM | POA: Diagnosis not present

## 2019-04-27 DIAGNOSIS — Z79899 Other long term (current) drug therapy: Secondary | ICD-10-CM | POA: Diagnosis not present

## 2019-05-11 ENCOUNTER — Other Ambulatory Visit: Payer: Self-pay | Admitting: Family Medicine

## 2019-05-18 ENCOUNTER — Other Ambulatory Visit: Payer: Self-pay | Admitting: Family Medicine

## 2019-05-18 NOTE — Telephone Encounter (Signed)
Ok to refill 

## 2019-05-24 DIAGNOSIS — G8929 Other chronic pain: Secondary | ICD-10-CM | POA: Diagnosis not present

## 2019-05-24 DIAGNOSIS — M545 Low back pain: Secondary | ICD-10-CM | POA: Diagnosis not present

## 2019-05-24 DIAGNOSIS — Z79899 Other long term (current) drug therapy: Secondary | ICD-10-CM | POA: Diagnosis not present

## 2019-05-24 DIAGNOSIS — K589 Irritable bowel syndrome without diarrhea: Secondary | ICD-10-CM | POA: Diagnosis not present

## 2019-06-19 ENCOUNTER — Other Ambulatory Visit: Payer: Self-pay | Admitting: Family Medicine

## 2019-06-19 NOTE — Telephone Encounter (Signed)
Ok to refill 

## 2019-06-22 DIAGNOSIS — G8929 Other chronic pain: Secondary | ICD-10-CM | POA: Diagnosis not present

## 2019-06-22 DIAGNOSIS — M545 Low back pain: Secondary | ICD-10-CM | POA: Diagnosis not present

## 2019-06-22 DIAGNOSIS — K589 Irritable bowel syndrome without diarrhea: Secondary | ICD-10-CM | POA: Diagnosis not present

## 2019-06-22 DIAGNOSIS — Z79899 Other long term (current) drug therapy: Secondary | ICD-10-CM | POA: Diagnosis not present
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
## 2019-07-17 DIAGNOSIS — M9901 Segmental and somatic dysfunction of cervical region: Secondary | ICD-10-CM | POA: Diagnosis not present

## 2019-07-17 DIAGNOSIS — M9903 Segmental and somatic dysfunction of lumbar region: Secondary | ICD-10-CM | POA: Diagnosis not present

## 2019-07-17 DIAGNOSIS — M62830 Muscle spasm of back: Secondary | ICD-10-CM | POA: Diagnosis not present

## 2019-07-17 DIAGNOSIS — M5136 Other intervertebral disc degeneration, lumbar region: Secondary | ICD-10-CM | POA: Diagnosis not present

## 2019-07-19 ENCOUNTER — Other Ambulatory Visit: Payer: Self-pay | Admitting: Family Medicine

## 2019-07-19 DIAGNOSIS — G8929 Other chronic pain: Secondary | ICD-10-CM | POA: Diagnosis not present

## 2019-07-19 DIAGNOSIS — K589 Irritable bowel syndrome without diarrhea: Secondary | ICD-10-CM | POA: Diagnosis not present

## 2019-07-19 DIAGNOSIS — Z79899 Other long term (current) drug therapy: Secondary | ICD-10-CM | POA: Diagnosis not present

## 2019-07-19 DIAGNOSIS — M545 Low back pain: Secondary | ICD-10-CM | POA: Diagnosis not present

## 2019-07-21 ENCOUNTER — Other Ambulatory Visit: Payer: Self-pay | Admitting: Family Medicine

## 2019-07-24 NOTE — Telephone Encounter (Signed)
Ok to refill 

## 2019-08-17 DIAGNOSIS — F17210 Nicotine dependence, cigarettes, uncomplicated: Secondary | ICD-10-CM | POA: Diagnosis not present

## 2019-08-17 DIAGNOSIS — G8929 Other chronic pain: Secondary | ICD-10-CM | POA: Diagnosis not present

## 2019-08-17 DIAGNOSIS — Z79899 Other long term (current) drug therapy: Secondary | ICD-10-CM | POA: Diagnosis not present

## 2019-08-17 DIAGNOSIS — M545 Low back pain: Secondary | ICD-10-CM | POA: Diagnosis not present

## 2019-08-24 DIAGNOSIS — R0602 Shortness of breath: Secondary | ICD-10-CM | POA: Diagnosis not present

## 2019-08-24 DIAGNOSIS — Z125 Encounter for screening for malignant neoplasm of prostate: Secondary | ICD-10-CM | POA: Diagnosis not present

## 2019-08-24 DIAGNOSIS — Z79899 Other long term (current) drug therapy: Secondary | ICD-10-CM | POA: Diagnosis not present

## 2019-08-24 DIAGNOSIS — R5383 Other fatigue: Secondary | ICD-10-CM | POA: Diagnosis not present

## 2019-08-24 DIAGNOSIS — Z1159 Encounter for screening for other viral diseases: Secondary | ICD-10-CM | POA: Diagnosis not present

## 2019-08-24 DIAGNOSIS — Z0000 Encounter for general adult medical examination without abnormal findings: Secondary | ICD-10-CM | POA: Diagnosis not present

## 2019-08-24 DIAGNOSIS — E559 Vitamin D deficiency, unspecified: Secondary | ICD-10-CM | POA: Diagnosis not present

## 2019-08-24 DIAGNOSIS — Z131 Encounter for screening for diabetes mellitus: Secondary | ICD-10-CM | POA: Diagnosis not present

## 2019-08-25 ENCOUNTER — Other Ambulatory Visit: Payer: Self-pay | Admitting: Family Medicine

## 2019-08-28 NOTE — Telephone Encounter (Signed)
Ok to refill 

## 2019-09-14 DIAGNOSIS — Z79899 Other long term (current) drug therapy: Secondary | ICD-10-CM | POA: Diagnosis not present

## 2019-09-14 DIAGNOSIS — M545 Low back pain: Secondary | ICD-10-CM | POA: Diagnosis not present

## 2019-09-14 DIAGNOSIS — G8929 Other chronic pain: Secondary | ICD-10-CM | POA: Diagnosis not present

## 2019-09-14 DIAGNOSIS — F902 Attention-deficit hyperactivity disorder, combined type: Secondary | ICD-10-CM | POA: Diagnosis not present

## 2019-09-26 ENCOUNTER — Other Ambulatory Visit: Payer: Self-pay | Admitting: Family Medicine

## 2019-10-19 ENCOUNTER — Other Ambulatory Visit: Payer: Self-pay | Admitting: Family Medicine

## 2019-10-19 NOTE — Telephone Encounter (Signed)
Requested Prescriptions   Pending Prescriptions Disp Refills  . cyclobenzaprine (FLEXERIL) 10 MG tablet [Pharmacy Med Name: CYCLOBENZAPRINE HCL 10 MG TAB] 30 tablet 0    Sig: TAKE 1 TABLET BY MOUTH 3 TIMES A DAY AS NEEDED     Last OV 01/10/2019   Last written 09/26/2019

## 2019-10-24 ENCOUNTER — Other Ambulatory Visit: Payer: Self-pay | Admitting: Family Medicine

## 2019-11-08 DIAGNOSIS — F17210 Nicotine dependence, cigarettes, uncomplicated: Secondary | ICD-10-CM | POA: Diagnosis not present

## 2019-11-08 DIAGNOSIS — Z79899 Other long term (current) drug therapy: Secondary | ICD-10-CM | POA: Diagnosis not present

## 2019-11-08 DIAGNOSIS — F902 Attention-deficit hyperactivity disorder, combined type: Secondary | ICD-10-CM | POA: Diagnosis not present

## 2019-11-08 DIAGNOSIS — G8929 Other chronic pain: Secondary | ICD-10-CM | POA: Diagnosis not present

## 2019-11-08 DIAGNOSIS — M545 Low back pain: Secondary | ICD-10-CM | POA: Diagnosis not present

## 2019-11-18 ENCOUNTER — Other Ambulatory Visit: Payer: Self-pay | Admitting: Family Medicine

## 2019-12-06 DIAGNOSIS — G8929 Other chronic pain: Secondary | ICD-10-CM | POA: Diagnosis not present

## 2019-12-06 DIAGNOSIS — F902 Attention-deficit hyperactivity disorder, combined type: Secondary | ICD-10-CM | POA: Diagnosis not present

## 2019-12-06 DIAGNOSIS — M545 Low back pain: Secondary | ICD-10-CM | POA: Diagnosis not present

## 2019-12-06 DIAGNOSIS — Z79899 Other long term (current) drug therapy: Secondary | ICD-10-CM | POA: Diagnosis not present

## 2019-12-15 DIAGNOSIS — Z87891 Personal history of nicotine dependence: Secondary | ICD-10-CM | POA: Diagnosis not present

## 2019-12-18 ENCOUNTER — Other Ambulatory Visit: Payer: Self-pay | Admitting: Family Medicine

## 2019-12-18 NOTE — Telephone Encounter (Signed)
Ok to refill 

## 2020-01-04 DIAGNOSIS — Z79899 Other long term (current) drug therapy: Secondary | ICD-10-CM | POA: Diagnosis not present

## 2020-01-04 DIAGNOSIS — M545 Low back pain: Secondary | ICD-10-CM | POA: Diagnosis not present

## 2020-01-04 DIAGNOSIS — G8929 Other chronic pain: Secondary | ICD-10-CM | POA: Diagnosis not present

## 2020-01-04 DIAGNOSIS — F902 Attention-deficit hyperactivity disorder, combined type: Secondary | ICD-10-CM | POA: Diagnosis not present

## 2020-01-22 ENCOUNTER — Other Ambulatory Visit: Payer: Self-pay | Admitting: Family Medicine

## 2020-02-01 DIAGNOSIS — G8929 Other chronic pain: Secondary | ICD-10-CM | POA: Diagnosis not present

## 2020-02-01 DIAGNOSIS — Z79899 Other long term (current) drug therapy: Secondary | ICD-10-CM | POA: Diagnosis not present

## 2020-02-01 DIAGNOSIS — M545 Low back pain, unspecified: Secondary | ICD-10-CM | POA: Diagnosis not present

## 2020-02-01 DIAGNOSIS — F902 Attention-deficit hyperactivity disorder, combined type: Secondary | ICD-10-CM | POA: Diagnosis not present

## 2020-02-21 DIAGNOSIS — M9903 Segmental and somatic dysfunction of lumbar region: Secondary | ICD-10-CM | POA: Diagnosis not present

## 2020-02-21 DIAGNOSIS — M62830 Muscle spasm of back: Secondary | ICD-10-CM | POA: Diagnosis not present

## 2020-02-21 DIAGNOSIS — M9901 Segmental and somatic dysfunction of cervical region: Secondary | ICD-10-CM | POA: Diagnosis not present

## 2020-02-21 DIAGNOSIS — M5136 Other intervertebral disc degeneration, lumbar region: Secondary | ICD-10-CM | POA: Diagnosis not present

## 2020-02-24 ENCOUNTER — Other Ambulatory Visit: Payer: Self-pay | Admitting: Family Medicine

## 2020-02-29 DIAGNOSIS — M545 Low back pain, unspecified: Secondary | ICD-10-CM | POA: Diagnosis not present

## 2020-02-29 DIAGNOSIS — F902 Attention-deficit hyperactivity disorder, combined type: Secondary | ICD-10-CM | POA: Diagnosis not present

## 2020-02-29 DIAGNOSIS — G8929 Other chronic pain: Secondary | ICD-10-CM | POA: Diagnosis not present

## 2020-02-29 DIAGNOSIS — Z79899 Other long term (current) drug therapy: Secondary | ICD-10-CM | POA: Diagnosis not present

## 2020-03-25 ENCOUNTER — Other Ambulatory Visit: Payer: Self-pay | Admitting: Family Medicine

## 2020-03-25 NOTE — Telephone Encounter (Signed)
Ok to refill 

## 2020-03-27 ENCOUNTER — Other Ambulatory Visit: Payer: Self-pay | Admitting: Family Medicine

## 2020-03-27 DIAGNOSIS — G8929 Other chronic pain: Secondary | ICD-10-CM | POA: Diagnosis not present

## 2020-03-27 DIAGNOSIS — F902 Attention-deficit hyperactivity disorder, combined type: Secondary | ICD-10-CM | POA: Diagnosis not present

## 2020-03-27 DIAGNOSIS — Z79899 Other long term (current) drug therapy: Secondary | ICD-10-CM | POA: Diagnosis not present

## 2020-03-27 DIAGNOSIS — M545 Low back pain, unspecified: Secondary | ICD-10-CM | POA: Diagnosis not present

## 2020-04-09 ENCOUNTER — Ambulatory Visit: Payer: Medicare Other | Admitting: Family Medicine

## 2020-04-10 DIAGNOSIS — M5136 Other intervertebral disc degeneration, lumbar region: Secondary | ICD-10-CM | POA: Diagnosis not present

## 2020-04-10 DIAGNOSIS — M9901 Segmental and somatic dysfunction of cervical region: Secondary | ICD-10-CM | POA: Diagnosis not present

## 2020-04-10 DIAGNOSIS — M62830 Muscle spasm of back: Secondary | ICD-10-CM | POA: Diagnosis not present

## 2020-04-10 DIAGNOSIS — M9903 Segmental and somatic dysfunction of lumbar region: Secondary | ICD-10-CM | POA: Diagnosis not present

## 2020-05-07 ENCOUNTER — Other Ambulatory Visit: Payer: Self-pay | Admitting: Family Medicine

## 2020-05-18 ENCOUNTER — Other Ambulatory Visit: Payer: Self-pay | Admitting: Family Medicine

## 2020-07-22 ENCOUNTER — Other Ambulatory Visit: Payer: Self-pay | Admitting: Family Medicine

## 2021-07-16 ENCOUNTER — Other Ambulatory Visit: Payer: Self-pay | Admitting: Family Medicine

## 2021-07-16 ENCOUNTER — Other Ambulatory Visit: Payer: Self-pay

## 2021-07-23 ENCOUNTER — Other Ambulatory Visit: Payer: Self-pay | Admitting: Family Medicine

## 2021-08-25 ENCOUNTER — Other Ambulatory Visit: Payer: Self-pay | Admitting: Family Medicine
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
## 2021-10-18 ENCOUNTER — Other Ambulatory Visit: Payer: Self-pay | Admitting: Family Medicine
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
## 2021-10-27 ENCOUNTER — Other Ambulatory Visit: Payer: Self-pay | Admitting: Family Medicine

## 2023-11-23 ENCOUNTER — Other Ambulatory Visit (HOSPITAL_COMMUNITY): Payer: Self-pay | Admitting: Family Medicine

## 2023-11-23 DIAGNOSIS — Z13220 Encounter for screening for lipoid disorders: Secondary | ICD-10-CM

## 2023-11-30 ENCOUNTER — Other Ambulatory Visit
